# Patient Record
Sex: Female | Born: 1955 | Race: White | Hispanic: No | Marital: Married | Smoking: Former smoker
Health system: Southern US, Community
[De-identification: ages and names within clinical notes are randomized; demographics above are authoritative.]

## PROBLEM LIST (undated history)

## (undated) DIAGNOSIS — J302 Other seasonal allergic rhinitis: Secondary | ICD-10-CM

## (undated) DIAGNOSIS — I10 Essential (primary) hypertension: Secondary | ICD-10-CM

## (undated) HISTORY — PX: TUBAL LIGATION: SHX77

## (undated) HISTORY — PX: NOSE SURGERY: SHX723

## (undated) HISTORY — PX: KNEE ARTHROSCOPY: SUR90

## (undated) HISTORY — PX: ABDOMINAL HYSTERECTOMY: SHX81

---

## 1998-09-19 ENCOUNTER — Encounter: Payer: Self-pay | Admitting: Emergency Medicine

## 1998-09-19 ENCOUNTER — Emergency Department (HOSPITAL_COMMUNITY): Admission: EM | Admit: 1998-09-19 | Discharge: 1998-09-19 | Payer: Self-pay | Admitting: Emergency Medicine

## 2005-03-13 ENCOUNTER — Emergency Department (HOSPITAL_COMMUNITY): Admission: EM | Admit: 2005-03-13 | Discharge: 2005-03-13 | Payer: Self-pay | Admitting: Family Medicine

## 2005-03-24 ENCOUNTER — Encounter: Admission: RE | Admit: 2005-03-24 | Discharge: 2005-03-24 | Payer: Self-pay | Admitting: Family Medicine

## 2008-05-22 ENCOUNTER — Encounter: Admission: RE | Admit: 2008-05-22 | Discharge: 2008-05-22 | Payer: Self-pay | Admitting: Orthopedic Surgery

## 2011-04-23 ENCOUNTER — Other Ambulatory Visit: Payer: Self-pay | Admitting: Family Medicine

## 2011-04-23 MED ORDER — LISINOPRIL-HYDROCHLOROTHIAZIDE 20-25 MG PO TABS: 1.0000 | ORAL_TABLET | Freq: Every day | ORAL | Status: DC

## 2011-05-06 ENCOUNTER — Encounter: Payer: Self-pay | Admitting: Physician Assistant

## 2011-05-06 ENCOUNTER — Ambulatory Visit (INDEPENDENT_AMBULATORY_CARE_PROVIDER_SITE_OTHER): Payer: BC Managed Care – PPO | Admitting: Physician Assistant

## 2011-05-06 DIAGNOSIS — E119 Type 2 diabetes mellitus without complications: Secondary | ICD-10-CM

## 2011-05-06 DIAGNOSIS — R635 Abnormal weight gain: Secondary | ICD-10-CM

## 2011-05-06 DIAGNOSIS — E669 Obesity, unspecified: Secondary | ICD-10-CM

## 2011-05-06 DIAGNOSIS — I10 Essential (primary) hypertension: Secondary | ICD-10-CM

## 2011-05-06 LAB — LIPID PANEL
Cholesterol: 254 mg/dL — ABNORMAL HIGH (ref 0–200)
HDL: 45 mg/dL (ref >39)
LDL Cholesterol: 188 mg/dL — ABNORMAL HIGH (ref 0–99)
Total CHOL/HDL Ratio: 5.6 Ratio
Triglycerides: 106 mg/dL (ref <150)
VLDL: 21 mg/dL (ref 0–40)

## 2011-05-06 LAB — COMPREHENSIVE METABOLIC PANEL
ALT: 20 U/L (ref 0–35)
AST: 17 U/L (ref 0–37)
Albumin: 4.3 g/dL (ref 3.5–5.2)
Alkaline Phosphatase: 67 U/L (ref 39–117)
BUN: 21 mg/dL (ref 6–23)
CO2: 21 mEq/L (ref 19–32)
Calcium: 9.7 mg/dL (ref 8.4–10.5)
Chloride: 101 mEq/L (ref 96–112)
Creat: 0.78 mg/dL (ref 0.50–1.10)
Glucose, Bld: 122 mg/dL — ABNORMAL HIGH (ref 70–99)
Potassium: 4.3 mEq/L (ref 3.5–5.3)
Sodium: 138 mEq/L (ref 135–145)
Total Bilirubin: 0.4 mg/dL (ref 0.3–1.2)
Total Protein: 7.3 g/dL (ref 6.0–8.3)

## 2011-05-06 LAB — CBC WITH DIFFERENTIAL/PLATELET
Basophils Absolute: 0.1 10*3/uL (ref 0.0–0.1)
Basophils Relative: 1 % (ref 0–1)
Hemoglobin: 15.3 g/dL — ABNORMAL HIGH (ref 12.0–15.0)
MCHC: 33.3 g/dL (ref 30.0–36.0)
Monocytes Relative: 10 % (ref 3–12)
Neutro Abs: 3 10*3/uL (ref 1.7–7.7)
Neutrophils Relative %: 40 % — ABNORMAL LOW (ref 43–77)
Platelets: 269 10*3/uL (ref 150–400)

## 2011-05-06 LAB — TSH: TSH: 0.667 u[IU]/mL (ref 0.350–4.500)

## 2011-05-06 LAB — POCT GLYCOSYLATED HEMOGLOBIN (HGB A1C): Hemoglobin A1C: 6.2

## 2011-05-06 MED ORDER — LISINOPRIL-HYDROCHLOROTHIAZIDE 20-25 MG PO TABS: 1.0000 | ORAL_TABLET | Freq: Every day | ORAL | Status: DC

## 2011-05-06 MED ORDER — LIDOCAINE (ANORECTAL) 5 % EX GEL: 1.0000 "application " | Freq: Two times a day (BID) | CUTANEOUS | Status: DC

## 2011-05-06 MED ORDER — METFORMIN HCL 500 MG PO TABS: 500.0000 mg | ORAL_TABLET | Freq: Two times a day (BID) | ORAL | Status: DC

## 2011-05-06 NOTE — Patient Instructions (Signed)
Work on dietary changes and exercise for weight loss.

## 2011-05-06 NOTE — Progress Notes (Signed)
  Subjective:    Patient ID: Olivia Middleton, female    DOB: [birth year], 56 y.o.   MRN: [id number]  HPI Doing well, but has had a very stressful year and just found out she's losing her job in April.  She has accepted that and is now ready to start back with exercise and healthier diet. Has only been taking metformin once daily.  Has gained weight- last weight here was 276.  Review of Systems  All other systems reviewed and are negative.       Objective:   Physical Exam  Nursing note and vitals reviewed. Constitutional: She is oriented to person, place, and time. She appears well-developed and well-nourished.  Neck: Normal range of motion. Neck supple.  Cardiovascular: Normal rate, regular rhythm and normal heart sounds.   Pulmonary/Chest: Effort normal and breath sounds normal.  Neurological: She is alert and oriented to person, place, and time.  Skin: Skin is warm and dry.   Results for orders placed in visit on 05/06/11  POCT GLYCOSYLATED HEMOGLOBIN (HGB A1C)      Component Value Range   Hemoglobin A1C 6.2             Assessment & Plan:  htn-stable Diabetes-not controlled Weight gain

## 2011-05-08 LAB — VITAMIN D 1,25 DIHYDROXY
Vitamin D 1, 25 (OH)2 Total: 46 pg/mL (ref 18–72)
Vitamin D2 1, 25 (OH)2: <8 pg/mL
Vitamin D3 1, 25 (OH)2: 46 pg/mL

## 2011-07-01 ENCOUNTER — Emergency Department (HOSPITAL_COMMUNITY)
Admission: EM | Admit: 2011-07-01 | Discharge: 2011-07-01 | Disposition: A | Discharge status: Home Health | Payer: Self-pay | Source: Home / Self Care | Attending: Family Medicine | Admitting: Family Medicine

## 2011-07-01 ENCOUNTER — Encounter (HOSPITAL_COMMUNITY): Payer: Self-pay

## 2011-07-01 DIAGNOSIS — IMO0002 Reserved for concepts with insufficient information to code with codable children: Secondary | ICD-10-CM

## 2011-07-01 DIAGNOSIS — S39012A Strain of muscle, fascia and tendon of lower back, initial encounter: Secondary | ICD-10-CM

## 2011-07-01 HISTORY — DX: Essential (primary) hypertension: I10

## 2011-07-01 LAB — POCT URINALYSIS DIP (DEVICE)
Glucose, UA: NEGATIVE mg/dL
Leukocytes, UA: NEGATIVE
Nitrite: NEGATIVE
Urobilinogen, UA: 0.2 mg/dL (ref 0.0–1.0)

## 2011-07-01 MED ORDER — CYCLOBENZAPRINE HCL 5 MG PO TABS: 5.0000 mg | ORAL_TABLET | Freq: Three times a day (TID) | ORAL | Status: DC | PRN

## 2011-07-01 MED ORDER — KETOROLAC TROMETHAMINE 30 MG/ML IJ SOLN
INTRAMUSCULAR | Status: AC
  Filled 2011-07-01: qty 1

## 2011-07-01 MED ORDER — DICLOFENAC POTASSIUM 50 MG PO TABS: 50.0000 mg | ORAL_TABLET | Freq: Three times a day (TID) | ORAL | Status: DC

## 2011-07-01 MED ORDER — KETOROLAC TROMETHAMINE 30 MG/ML IJ SOLN
30.0000 mg | Freq: Once | INTRAMUSCULAR | Status: AC
  Administered 2011-07-01: 30 mg via INTRAMUSCULAR

## 2011-07-01 NOTE — ED Provider Notes (Signed)
History     CSN: [id number]  Arrival date & time 07/01/11  8657   First MD Initiated Contact with Patient 07/01/11 1953      Chief Complaint  Patient presents with  . Back Pain    (Consider location/radiation/quality/duration/timing/severity/associated sxs/prior treatment) Patient is a 56 y.o. female presenting with back pain. The history is provided by the patient and a parent.  Back Pain  This is a new problem. The current episode started yesterday. The problem occurs constantly. The problem has been gradually worsening. The pain is associated with no known injury (onset sl at funeral long standing yest, improved with advil , better until long drive to [facility] today, no gi or gu sx no radiation into legs, sore with movement.). The pain is present in the sacro-iliac joint.    Past Medical History  Diagnosis Date  . Hypertension   . Diabetes mellitus     Past Surgical History  Procedure Date  . Abdominal hysterectomy   . Knee arthroscopy     No family history on file.  History  Substance Use Topics  . Smoking status: Former Games developer  . Smokeless tobacco: Not on file  . Alcohol Use: Yes    OB History    Grav Para Term Preterm Abortions TAB SAB Ect Mult Living                  Review of Systems  Constitutional: Negative.   Gastrointestinal: Negative.   Genitourinary: Negative.   Musculoskeletal: Positive for back pain.  Neurological: Negative.     Allergies  Tetracyclines & related  Home Medications   Current Outpatient Rx  Name Route Sig Dispense Refill  . CYCLOBENZAPRINE HCL 5 MG PO TABS Oral Take 1 tablet (5 mg total) by mouth 3 (three) times daily as needed for muscle spasms. 30 tablet 0  . DICLOFENAC POTASSIUM 50 MG PO TABS Oral Take 1 tablet (50 mg total) by mouth 3 (three) times daily. 30 tablet 0  . LISINOPRIL-HYDROCHLOROTHIAZIDE 20-25 MG PO TABS Oral Take 1 tablet by mouth daily. 90 tablet 1    Needs office visit--2nd notice  . METFORMIN HCL 500 MG PO  TABS Oral Take 1 tablet (500 mg total) by mouth 2 (two) times daily with a meal. 180 tablet 2    BP 153/91  Pulse 95  Temp(Src) 98.2 F (36.8 C) (Oral)  Resp 23  SpO2 100%  Physical Exam  Nursing note and vitals reviewed. Constitutional: She is oriented to person, place, and time. She appears well-developed and well-nourished. She is cooperative.  Neck: Normal range of motion. Neck supple.  Abdominal: Soft. Bowel sounds are normal.  Musculoskeletal: She exhibits tenderness.       Arms: Lymphadenopathy:    She has no cervical adenopathy.  Neurological: She is alert and oriented to person, place, and time.  Skin: Skin is warm and dry.    ED Course  Procedures (including critical care time)  Labs Reviewed  POCT URINALYSIS DIP (DEVICE) - Abnormal; Notable for the following:    Bilirubin Urine SMALL (*)    Hgb urine dipstick SMALL (*)    Protein, ur 30 (*)    All other components within normal limits   No results found.   1. Back strain       MDM          Linna Hoff, MD 07/01/11 [phone]

## 2011-07-01 NOTE — ED Notes (Signed)
Pt c/o Lower back pain.  Pt states pain concentrated on L side.  Pt denies dysuria, hematuria.  States she has voided more frequently.  Pt took 600mg  Ibuprofen around 3pm with some relief.

## 2011-07-01 NOTE — ED Notes (Signed)
Bed:UCPR1<BR> Expected date:<BR> Expected time:<BR> Means of arrival:<BR> Comments:<BR>

## 2011-07-05 ENCOUNTER — Encounter (HOSPITAL_COMMUNITY): Payer: Self-pay | Admitting: *Deleted

## 2011-07-05 ENCOUNTER — Emergency Department (HOSPITAL_COMMUNITY)
Admission: EM | Admit: 2011-07-05 | Discharge: 2011-07-05 | Disposition: A | Discharge status: Home / Self Care | Payer: Self-pay | Source: Home / Self Care | Attending: Emergency Medicine | Admitting: Emergency Medicine

## 2011-07-05 DIAGNOSIS — S335XXA Sprain of ligaments of lumbar spine, initial encounter: Secondary | ICD-10-CM

## 2011-07-05 DIAGNOSIS — M461 Sacroiliitis, not elsewhere classified: Secondary | ICD-10-CM

## 2011-07-05 DIAGNOSIS — R05 Cough: Secondary | ICD-10-CM

## 2011-07-05 DIAGNOSIS — S39012A Strain of muscle, fascia and tendon of lower back, initial encounter: Secondary | ICD-10-CM

## 2011-07-05 HISTORY — DX: Other seasonal allergic rhinitis: J30.2

## 2011-07-05 MED ORDER — HYDROCODONE-ACETAMINOPHEN 5-325 MG PO TABS: 2.0000 | ORAL_TABLET | ORAL | Status: DC | PRN

## 2011-07-05 MED ORDER — PREDNISONE 20 MG PO TABS: ORAL_TABLET | ORAL | Status: AC

## 2011-07-05 MED ORDER — ALBUTEROL SULFATE HFA 108 (90 BASE) MCG/ACT IN AERS: 1.0000 | INHALATION_SPRAY | Freq: Four times a day (QID) | RESPIRATORY_TRACT | Status: DC | PRN

## 2011-07-05 MED ORDER — FLUTICASONE PROPIONATE 50 MCG/ACT NA SUSP: 2.0000 | Freq: Every day | NASAL | Status: DC

## 2011-07-05 NOTE — ED Provider Notes (Signed)
History     CSN: [id number]  Arrival date & time 07/05/11  0957   None     Chief Complaint  Patient presents with  . Back Pain    (Consider location/radiation/quality/duration/timing/severity/associated sxs/prior treatment) HPI Comments: Patient reports 6 days of achy, left sided, lower back/SI joint pain, worse with bending forward, lying down flat, sitting for prolonged periods of time, coughing. States it is worse during the day., Say is unable to find a comfortable position States it got worse after standing for a long period of time at a funeral, and a prolonged drive. States it is now wrapping around her hip. Denies any radiation down the back of her leg. No change in physical activity, recent or remote history of trauma to the back or hip. No fevers, nausea, vomiting, abdominal pain, urinary complaints, vaginal bleeding, saddle anesthesia, urinary/fecal incontinence, weakness in her legs, rash. She was evaluated for this 4 days ago the urgent care, thought to have low back strain, and sent home with Flexeril and diclofenac. States she is taking the Flexeril as directly, without much relief. She was taking the diclofenac as written, but states it made her dizzy, it wasn't helping, so she stopped it after a day. She has resumed taking 600 milligrams ibuprofen which helps somewhat. Patient has a history of chronic back pain, cancer, IVDU, prolonged steroid use, kidney stones.  Patient also reports having rhinorrhea, postnasal drip, nonproductive cough, sore throat which is worse at night for the past 2 weeks. Think that this may be from her allergies. Believes that her back pain is from all coughing that she is doing. No wheezing, chest pain, shortness of breath. No headaches, ear pain, sinus pain/pressure. Patient quit smoking 6 years ago. No history of asthma, emphysema, pneumonia.   ROS as noted in HPI. All other ROS negative.   Patient is a 56 y.o. female presenting with back pain and  cough. The history is provided by the patient.  Back Pain   Cough    Past Medical History  Diagnosis Date  . Hypertension   . Diabetes mellitus   . Seasonal allergies     Past Surgical History  Procedure Date  . Abdominal hysterectomy   . Knee arthroscopy     History reviewed. No pertinent family history.  History  Substance Use Topics  . Smoking status: Former Games developer  . Smokeless tobacco: Not on file  . Alcohol Use: Yes    OB History    Grav Para Term Preterm Abortions TAB SAB Ect Mult Living                  Review of Systems  Respiratory: Positive for cough.   Musculoskeletal: Positive for back pain.    Allergies  Tetracyclines & related  Home Medications   Current Outpatient Rx  Name Route Sig Dispense Refill  . CYCLOBENZAPRINE HCL 5 MG PO TABS Oral Take 1 tablet (5 mg total) by mouth 3 (three) times daily as needed for muscle spasms. 30 tablet 0  . IBUPROFEN 200 MG PO TABS Oral Take 600 mg by mouth every 6 (six) hours as needed.    Marland Kitchen LISINOPRIL-HYDROCHLOROTHIAZIDE 20-25 MG PO TABS Oral Take 1 tablet by mouth daily. 90 tablet 1    Needs office visit--2nd notice  . METFORMIN HCL 500 MG PO TABS Oral Take 1 tablet (500 mg total) by mouth 2 (two) times daily with a meal. 180 tablet 2  . ALBUTEROL SULFATE HFA 108 (90 BASE) MCG/ACT  IN AERS Inhalation Inhale 1-2 puffs into the lungs every 6 (six) hours as needed for wheezing. 1 Inhaler 0  . FLUTICASONE PROPIONATE 50 MCG/ACT NA SUSP Nasal Place 2 sprays into the nose daily. 16 g 2  . HYDROCODONE-ACETAMINOPHEN 5-325 MG PO TABS Oral Take 2 tablets by mouth every 4 (four) hours as needed for pain. 20 tablet 0  . PREDNISONE 20 MG PO TABS  Take 3 tabs po on first day, 2 tabs second day, 2 tabs third day, 1 tab fourth day, 1 tab 5th day. Take with food. 9 tablet 0    BP 149/93  Pulse 96  Temp(Src) 97.6 F (36.4 C) (Oral)  Resp 20  SpO2 96%  Physical Exam  Nursing note and vitals reviewed. Constitutional: She  is oriented to person, place, and time. She appears well-developed and well-nourished. No distress.  HENT:  Head: Normocephalic and atraumatic.  Right Ear: Tympanic membrane normal.  Left Ear: Tympanic membrane normal.  Nose: Mucosal edema and rhinorrhea present. Right sinus exhibits no maxillary sinus tenderness and no frontal sinus tenderness. Left sinus exhibits no maxillary sinus tenderness and no frontal sinus tenderness.       Irritated, cobblestoned oropharynx.  Eyes: Conjunctivae and EOM are normal.  Neck: Normal range of motion.  Cardiovascular: Normal rate, regular rhythm and normal heart sounds.   Pulmonary/Chest: Effort normal and breath sounds normal. She exhibits tenderness.       Diffuse chest wall tenderness  Abdominal: Soft. Bowel sounds are normal. She exhibits no distension and no mass. There is no rebound, no guarding and no CVA tenderness.       Diffuse tenderness over rectus abdominis  Musculoskeletal: Normal range of motion.       Back:       Left SI joint tenderness. Bilateral lower extremities nontender, baseline ROM with intact  PT pulses. No pain with PROM hips bilaterally. SLR neg bilaterally. Sensation baseline light touch bilaterally for Pt, DTR's symmetric and intact bilaterally KJ. Motor 4/5 hip flexion left side secondary to pain, but 5/5, quadriceps, hamstrings, EHL, foot dorsiflexion, foot plantarflexion bilaterally, gait somewhat antalgic but without apparent new ataxia.   Neurological: She is alert and oriented to person, place, and time.  Skin: Skin is warm and dry.  Psychiatric: She has a normal mood and affect. Her behavior is normal. Judgment and thought content normal.    ED Course  Procedures (including critical care time)  Labs Reviewed - No data to display No results found.   1. Allergic cough   2. Sacroiliac inflammation   3. Lumbar strain     MDM  Previous records and labs reviewed. Patient was seen here on 5/14 back pain located in  the SI joint. Thought to have back strain. Sent home with Flexeril diclofenac.   [place] narcotic database reviewed. Pt with no narcotic rx over the past year.   H&P most consistent with cough from allergies. Starting her on Flonase, saline nasal irrigation. May also have a component of reactive airways with this. Sending home with albuterol inhaler. Will send home with Norco to help with her cough, but this will also help with her lower back pain. Will have her continue the Flexeril, ibuprofen. Sending home with steroids, which will help with reactive airways, also with SI joint inflammation. Discussed with her that this will aid her sugars. She'll follow with her primary care physician as needed. Patient agrees with plan.     Luiz Blare, MD 07/05/11 1056

## 2011-07-05 NOTE — Discharge Instructions (Signed)
Take the medication as written. Take 1 gram of tylenol with the motrin up to 4 times a day as needed for pain and fever. This is an effective combination for pain. Take the hydrocodone/norco only for severe pain. Do not take the tylenol and hydrocodone/norcot as they both have tylenol in them and too much can hurt your liver. Return if you get worse, have a  fever >100.4, or for any concerns.   2 puffs from your albuterol every 4-6 hrs. Decrease the frequency as you feel better. it can make your heart beat fast, so decrease the use if it is making you feel too jittery or giving you palpitations. Start using a neilmed sinus rinse, neti pot, or saline nasal irrigation to help with your nasal congestion.   Go to www.goodrx.com to look up your medications. This will give you a list of where you can find your prescriptions at the most affordable prices.

## 2011-07-05 NOTE — ED Notes (Addendum)
Pt with c/o left lower back pain now radiating down left thigh - pain worse with coughing - pain at rest and with movement - pt with c/o allergies with cough x 2 weeks  - pt seen and treated Wednesday low back pain flexeril and diclofenac given per pt has not been taking diclofenac made her feel dizzy

## 2011-07-11 ENCOUNTER — Ambulatory Visit: Payer: Self-pay

## 2011-07-11 ENCOUNTER — Ambulatory Visit: Payer: Self-pay | Admitting: Family Medicine

## 2011-07-11 VITALS — BP 130/80 | HR 82 | Temp 98.0°F | Resp 16 | Ht 66.0 in | Wt 293.0 lb

## 2011-07-11 DIAGNOSIS — M549 Dorsalgia, unspecified: Secondary | ICD-10-CM

## 2011-07-11 MED ORDER — HYDROCODONE-ACETAMINOPHEN 5-325 MG PO TABS: 1.0000 | ORAL_TABLET | ORAL | Status: AC | PRN

## 2011-07-11 MED ORDER — PREDNISONE 20 MG PO TABS: ORAL_TABLET | ORAL | Status: AC

## 2011-07-11 MED ORDER — CYCLOBENZAPRINE HCL 10 MG PO TABS: 10.0000 mg | ORAL_TABLET | Freq: Three times a day (TID) | ORAL | Status: AC | PRN

## 2011-07-11 NOTE — Progress Notes (Signed)
Patient Name: Olivia Middleton Date of Birth: [birth year] Medical Record Number: [id number] Gender: female Date of Encounter: 07/11/2011  History of Present Illness:  Olivia Middleton is a 56 y.o. very pleasant female patient who presents with the following:  Here to evaluate back pain.  On 07/01/11 she drove to [place] and back.  She noted back pain during the drive.  Then suddenly she had worsening pain after she got out of the car.  She has been to [facility] twice and been treated with muscle relaxers, pain medication and steriods. She also notes numbness in his left anterior thigh. She also notes stress incontinence with cough or sneeze but this is not new.  However, she has also had a cough recently so this is exacerbating the problem.  She was started on prednisone and hydrocodone when she visited the UC the second time on 07/05/11- this did sem to help but after her 5 days of prednisone were finished she felt worse again.    Olivia Middleton had excellent A1c in March, but she does not usually check her glucose.  She is without insurance right now so extensive evaluation (MRI) is very difficult for her  Patient Active Problem List  Diagnoses  . Essential hypertension, benign  . Type II or unspecified type diabetes mellitus without mention of complication, not stated as uncontrolled   Past Medical History  Diagnosis Date  . Hypertension   . Diabetes mellitus   . Seasonal allergies    Past Surgical History  Procedure Date  . Abdominal hysterectomy   . Knee arthroscopy    History  Substance Use Topics  . Smoking status: Former Games developer  . Smokeless tobacco: Not on file  . Alcohol Use: Yes   No family history on file. Allergies  Allergen Reactions  . Tetracyclines & Related     Medication list has been reviewed and updated.  Review of Systems: As per HPI- otherwise negative.  Physical Examination: Filed Vitals:   07/11/11 1257  BP: 130/80  Pulse: 82  Temp: 98 F (36.7 C)  TempSrc:  Oral  Resp: 16  Height: 5\' 6"  (1.676 m)  Weight: 293 lb (132.904 kg)    Body mass index is 47.29 kg/(m^2).  GEN: WDWN, NAD, Non-toxic, A & O x 3, obese HEENT: Atraumatic, Normocephalic. Neck supple. No masses, No LAD.  TM, oropharynx wnl Ears and Nose: No external deformity. CV: RRR, No M/G/R. No JVD. No thrill. No extra heart sounds. PULM: CTA B, no wheezes, crackles, rhonchi. No retractions. No resp. distress. No accessory muscle use. EXTR: No c/c/e NEURO Normal gait.  PSYCH: Normally interactive. Conversant. Not depressed or anxious appearing.  Calm demeanor.  She has tenderness around her left SI joint and into her left buttock.  Spine flexion and extension is preserved.  Excellent strength of both legs, no foot drop, no gait change.  She has decreased sensation in the left anterior thigh and into her left hip flexor area, but does not have genital/ anal/ inner thigh numbness. Normal rectal tone.  Normal patellar DTR.  Negative straight leg raise  [facility] reading (PRIMARY) by  Dr. Patsy Lager.  Negative lumbar spine  LUMBAR SPINE - COMPLETE 4+ VIEW  Comparison: Preliminary reading of Dr. Patsy Lager  Findings: Five views of the lumbar spine submitted. No acute fracture or subluxation. Study is limited by patient's large body habitus. Atherosclerotic calcifications of the abdominal aorta and the iliac arteries are noted. Alignment and vertebral height are preserved.  IMPRESSION: No acute  fracture or subluxation.   Assessment and Plan 1. Back pain  DG Lumbar Spine Complete, HYDROcodone-acetaminophen (NORCO) 5-325 MG per tablet, cyclobenzaprine (FLEXERIL) 10 MG tablet, predniSONE (DELTASONE) 20 MG tablet   Suspect that Diany likely has a bulging disc and nerve impingement vs sciatica.  Had a long conversation with her and her husband about CES- however she does not meet these criteria at this time.  Went over the symptoms (genital numbness, incontinence not just due to cough/ sneeze,  difficulty walking or leg weakness) which should prompt an urgent trip to the ED.  Raeonna does have DM II, but had an excellent A1c  2 months ago.  Should be able to tolerate prednisone but urged her to drink plenty of water and watch her diet.  If she is not feeling better with the prednisone, or if her symptoms worsen or change she will call or RTC.  Next step is likely an MRI.

## 2011-11-11 ENCOUNTER — Ambulatory Visit: Payer: BC Managed Care – PPO | Admitting: Physician Assistant

## 2013-03-04 IMAGING — CR DG LUMBAR SPINE COMPLETE 4+V
5 series · 5 of 5 positions shown · non-contrast
Comparison: Preliminary reading of Dr. Habtom

CLINICAL DATA: Lower back pain

LUMBAR SPINE - COMPLETE 4+ VIEW

[AP]
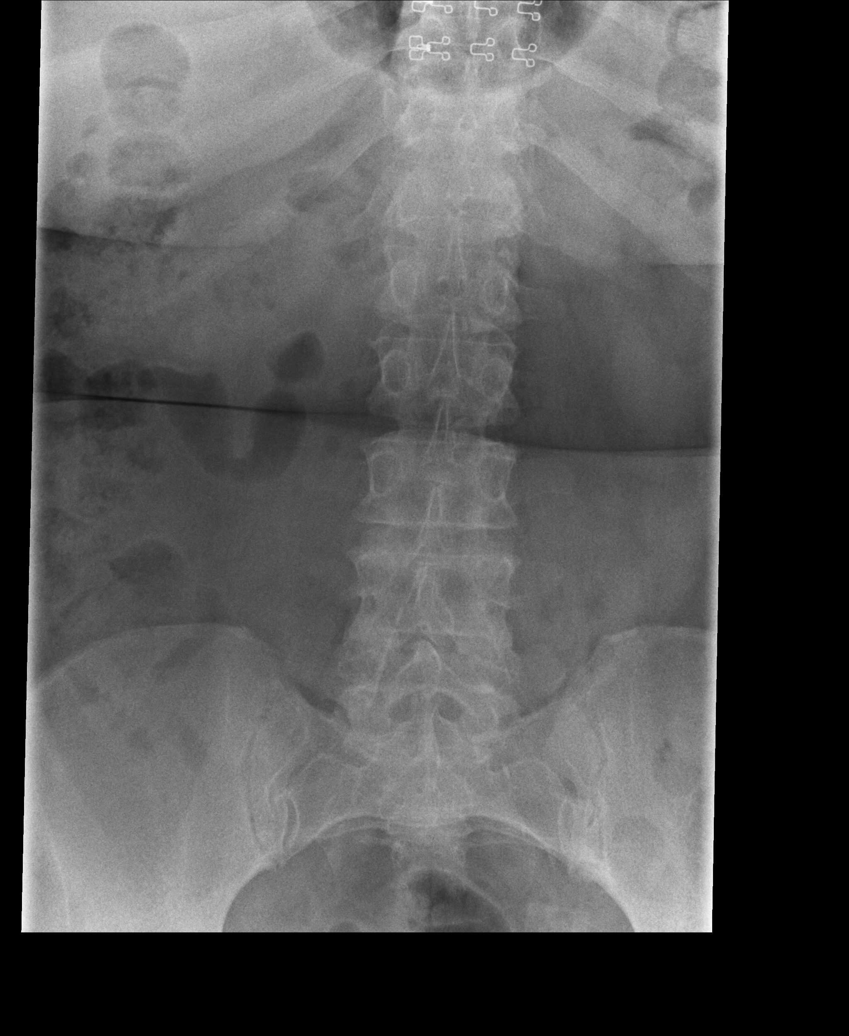

[rpo]
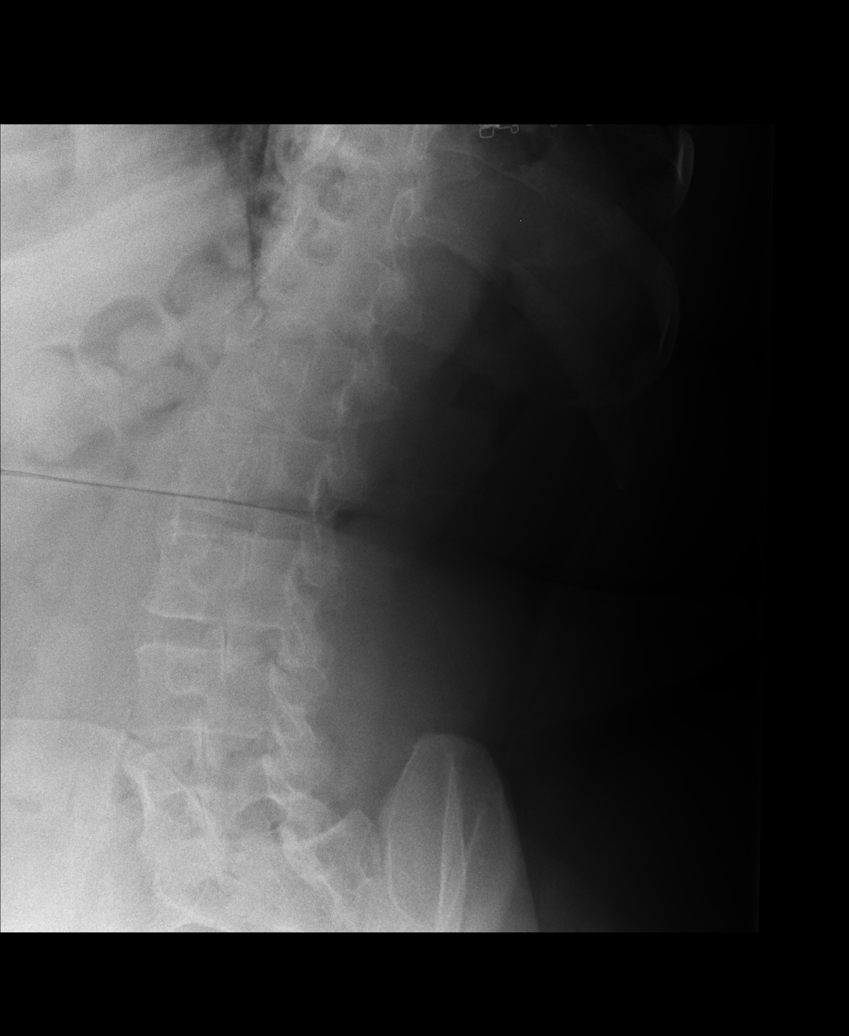

[lpo]
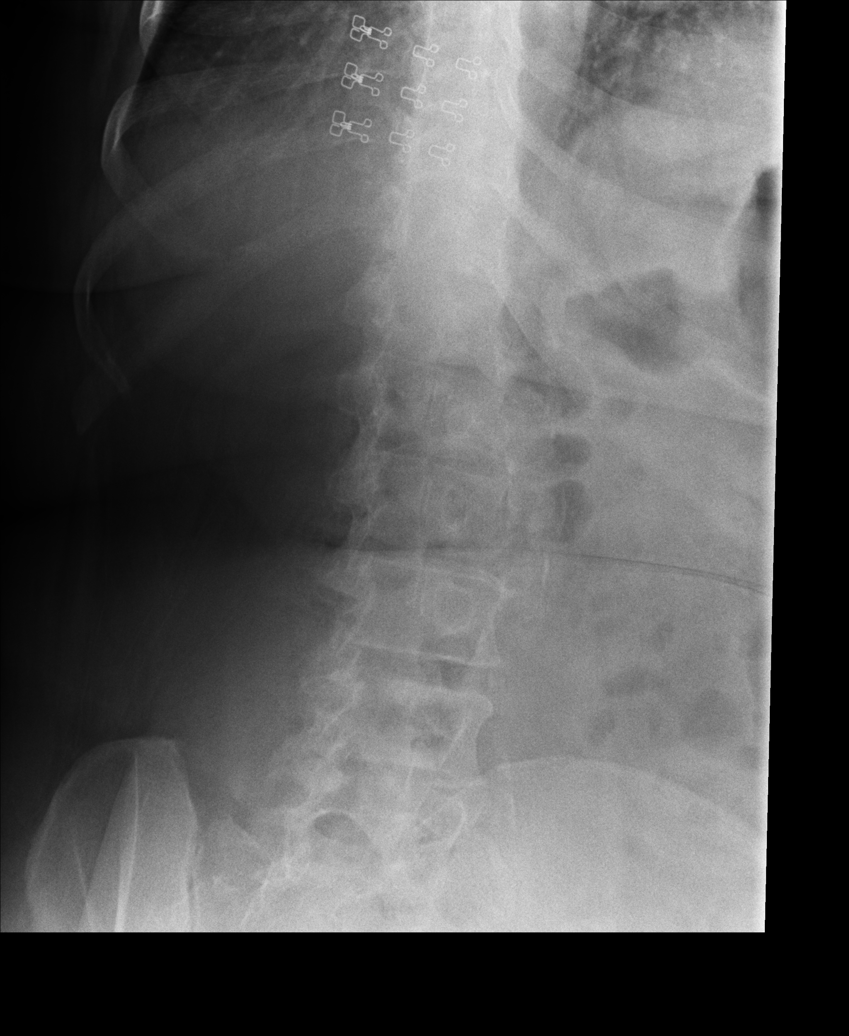

[lateral]
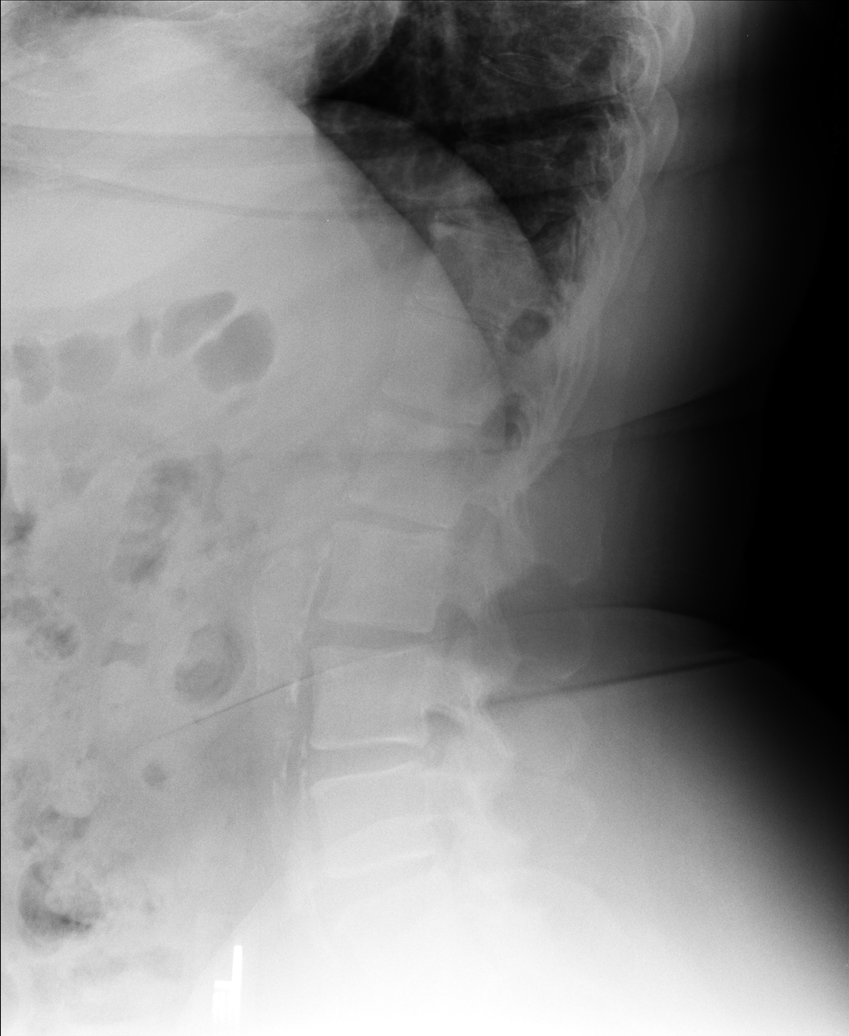

[l5 s1]
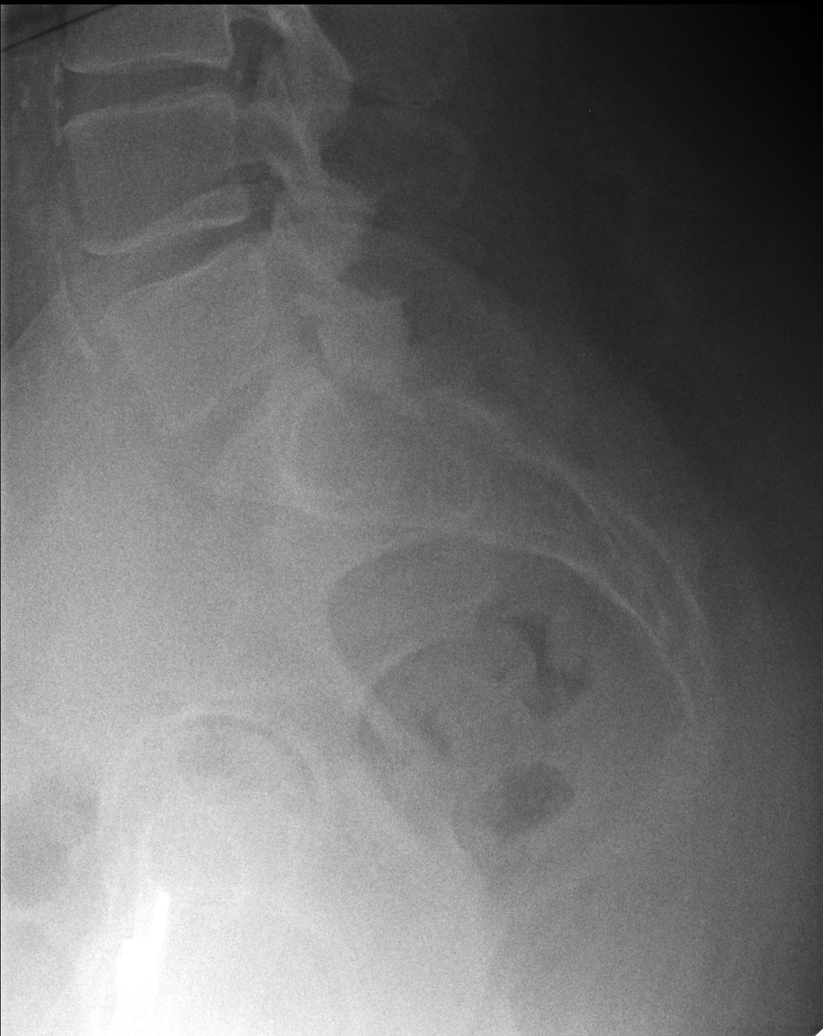

[5 of 5 positions shown; findings below may reference images not displayed]

FINDINGS: Five views of the lumbar spine submitted.  No acute
fracture or subluxation.  Study is limited by patient's large body
habitus.  Atherosclerotic calcifications of the abdominal aorta and
the iliac arteries are noted.  Alignment and vertebral height are
preserved.
IMPRESSION: No acute fracture or subluxation.

Clinically significant discrepancy from primary report, if
provided: None

## 2014-02-14 ENCOUNTER — Emergency Department (HOSPITAL_COMMUNITY)
Admission: EM | Admit: 2014-02-14 | Discharge: 2014-02-14 | Disposition: A | Discharge status: Home / Self Care | Payer: BC Managed Care – PPO | Source: Home / Self Care | Attending: Emergency Medicine | Admitting: Emergency Medicine

## 2014-02-14 ENCOUNTER — Encounter (HOSPITAL_COMMUNITY): Payer: Self-pay | Admitting: *Deleted

## 2014-02-14 DIAGNOSIS — N390 Urinary tract infection, site not specified: Secondary | ICD-10-CM

## 2014-02-14 LAB — POCT URINALYSIS DIP (DEVICE)
BILIRUBIN URINE: NEGATIVE
GLUCOSE, UA: NEGATIVE mg/dL
Ketones, ur: NEGATIVE mg/dL
NITRITE: NEGATIVE
Protein, ur: 30 mg/dL — AB
SPECIFIC GRAVITY, URINE: 1.025 (ref 1.005–1.030)
UROBILINOGEN UA: 1 mg/dL (ref 0.0–1.0)
pH: 6.5 (ref 5.0–8.0)

## 2014-02-14 MED ORDER — CIPROFLOXACIN HCL 500 MG PO TABS: 500.0000 mg | ORAL_TABLET | Freq: Two times a day (BID) | ORAL | Status: DC

## 2014-02-14 NOTE — ED Provider Notes (Signed)
CSN: [id number]     Arrival date & time 02/14/14  1113 History   None    Chief Complaint  Patient presents with  . Urinary Tract Infection   (Consider location/radiation/quality/duration/timing/severity/associated sxs/prior Treatment) HPI Olivia Middleton is a 58 y.o. female who [facility]. She states that about 4 days ago she had urgency with mild dysuria at the end of the stream. She took OTC AZO and got relief. The past 2 days the symptoms returned and are worse. She has had a few UTI's over the last several years and this feels the same. She denies any other problems.   Past Medical History  Diagnosis Date  . Hypertension   . Diabetes mellitus   . Seasonal allergies    Past Surgical History  Procedure Laterality Date  . Abdominal hysterectomy    . Knee arthroscopy     History reviewed. No pertinent family history. History  Substance Use Topics  . Smoking status: Former Games developermoker  . Smokeless tobacco: Not on file  . Alcohol Use: Yes   OB History    No data available     Review of Systems Negative except as stated in HPI  Allergies  Tetracyclines & related  Home Medications   Prior to Admission medications   Medication Sig Start Date End Date Taking? Authorizing Provider  albuterol (PROVENTIL HFA;VENTOLIN HFA) 108 (90 BASE) MCG/ACT inhaler Inhale 1-2 puffs into the lungs every 6 (six) hours as needed for wheezing. 07/05/11 07/04/12  Domenick GongAshley Mortenson, MD  ciprofloxacin (CIPRO) 500 MG tablet Take 1 tablet (500 mg total) by mouth 2 (two) times daily. 02/14/14   Philip Kotlyar Orlene OchM Ephram Kornegay, NP  fluticasone (FLONASE) 50 MCG/ACT nasal spray Place 2 sprays into the nose daily. 07/05/11 07/04/12  Domenick GongAshley Mortenson, MD  ibuprofen (ADVIL,MOTRIN) 200 MG tablet Take 600 mg by mouth every 6 (six) hours as needed.    Historical Provider, MD  lisinopril-hydrochlorothiazide (PRINZIDE,ZESTORETIC) 20-25 MG per tablet Take 1 tablet by mouth daily. 05/06/11 05/05/12  Anders SimmondsAngela M McClung, PA-C   metFORMIN (GLUCOPHAGE) 500 MG tablet Take 1 tablet (500 mg total) by mouth 2 (two) times daily with a meal. 05/06/11   Marzella SchleinAngela M McClung, PA-C   BP 178/86 mmHg  Pulse 82  Temp(Src) 98.2 F (36.8 C) (Oral)  Resp 16  SpO2 95% Physical Exam  Constitutional: She is oriented to person, place, and time. She appears well-developed and well-nourished.  HENT:  Head: Normocephalic and atraumatic.  Eyes: EOM are normal.  Neck: Neck supple.  Cardiovascular: Normal rate and regular rhythm.   Pulmonary/Chest: Effort normal and breath sounds normal.  Abdominal: Soft. Bowel sounds are normal. There is no tenderness. There is no CVA tenderness.  Musculoskeletal: Normal range of motion.  Neurological: She is alert and oriented to person, place, and time. No cranial nerve deficit.  Skin: Skin is warm and dry.  Psychiatric: She has a normal mood and affect. Her behavior is normal.  Nursing note and vitals reviewed.   ED Course  Procedures (including critical care time) Results for orders placed or performed during the hospital encounter of 02/14/14 (from the past 24 hour(s))  POCT urinalysis dip (device)     Status: Abnormal   Collection Time: 02/14/14 12:10 PM  Result Value Ref Range   Glucose, UA NEGATIVE NEGATIVE mg/dL   Bilirubin Urine NEGATIVE NEGATIVE   Ketones, ur NEGATIVE NEGATIVE mg/dL   Specific Gravity, Urine 1.025 1.005 - 1.030   Hgb urine dipstick MODERATE (A)  NEGATIVE   pH 6.5 5.0 - 8.0   Protein, ur 30 (A) NEGATIVE mg/dL   Urobilinogen, UA 1.0 0.0 - 1.0 mg/dL   Nitrite NEGATIVE NEGATIVE   Leukocytes, UA LARGE (A) NEGATIVE    Imaging Review No results found. Urine sent for culture  MDM  58 y.o. female with dysuria. Stable for d/c without fever, chills or signs of pyelo. Discussed with the patient and all questioned fully answered. She will return if any problems arise.    Medication List    TAKE these medications        ciprofloxacin 500 MG tablet  Commonly known as:   CIPRO  Take 1 tablet (500 mg total) by mouth 2 (two) times daily.      ASK your doctor about these medications        albuterol 108 (90 BASE) MCG/ACT inhaler  Commonly known as:  PROVENTIL HFA;VENTOLIN HFA  Inhale 1-2 puffs into the lungs every 6 (six) hours as needed for wheezing.     fluticasone 50 MCG/ACT nasal spray  Commonly known as:  FLONASE  Place 2 sprays into the nose daily.     ibuprofen 200 MG tablet  Commonly known as:  ADVIL,MOTRIN  Take 600 mg by mouth every 6 (six) hours as needed.     lisinopril-hydrochlorothiazide 20-25 MG per tablet  Commonly known as:  PRINZIDE,ZESTORETIC  Take 1 tablet by mouth daily.     metFORMIN 500 MG tablet  Commonly known as:  GLUCOPHAGE  Take 1 tablet (500 mg total) by mouth 2 (two) times daily with a meal.        1. UTI (lower urinary tract infection)     Janne NapoleonHope M Clarabell Matsuoka, NP 02/14/14 1227

## 2014-02-14 NOTE — ED Notes (Signed)
Pt    Reports   Symptoms  Of  Urinary  Frequency    With    Burning  On  Urination       With  Symptoms    X   5  Days          Pt  states  Has  Been taking  Azo   With   Only  Partial  releif

## 2014-02-21 ENCOUNTER — Telehealth (HOSPITAL_COMMUNITY): Payer: Self-pay | Admitting: *Deleted

## 2014-02-21 NOTE — ED Notes (Addendum)
Urine culture cancelled.  Dr. Lorenz CoasterKeller asked me to call pt. for clinical improvement.  She is on Cipro.  I called pt. and left a message to call.  Call 1. Olivia Middleton, Olivia Middleton 02/21/2014 Pt. called back and said she has finished her medicine today and has no symptoms.  She is better.  Dr. Lorenz CoasterKeller notified. Olivia Middleton, Olivia Middleton 02/21/2014

## 2014-04-13 NOTE — ED Notes (Signed)
Urine culture cancelled.  Dr. Lorenz CoasterKeller asked me to call pt. for clinical improvement.  She is on Cipro.  I called pt. and left a message to call.  Call 1. Olivia Middleton, Ashaki Frosch M 04/13/2014 Pt. called back and said she has finished her medicine today and has no symptoms.  She is better.  Dr. Lorenz CoasterKeller notified. Cherly AndersonYork, Manette Doto M

## 2014-04-16 ENCOUNTER — Encounter: Payer: Self-pay | Admitting: Family Medicine

## 2014-04-16 ENCOUNTER — Ambulatory Visit (INDEPENDENT_AMBULATORY_CARE_PROVIDER_SITE_OTHER): Payer: BLUE CROSS/BLUE SHIELD | Admitting: Family Medicine

## 2014-04-16 VITALS — BP 180/102 | HR 88 | Temp 98.1°F | Resp 16 | Ht 66.5 in | Wt 325.0 lb

## 2014-04-16 DIAGNOSIS — Z6841 Body Mass Index (BMI) 40.0 and over, adult: Secondary | ICD-10-CM

## 2014-04-16 DIAGNOSIS — Z8639 Personal history of other endocrine, nutritional and metabolic disease: Secondary | ICD-10-CM

## 2014-04-16 DIAGNOSIS — I10 Essential (primary) hypertension: Secondary | ICD-10-CM

## 2014-04-16 LAB — COMPREHENSIVE METABOLIC PANEL
ALBUMIN: 3.7 g/dL (ref 3.5–5.2)
ALK PHOS: 77 U/L (ref 39–117)
ALT: 28 U/L (ref 0–35)
AST: 23 U/L (ref 0–37)
BUN: 12 mg/dL (ref 6–23)
CO2: 25 mEq/L (ref 19–32)
CREATININE: 0.77 mg/dL (ref 0.50–1.10)
Calcium: 9 mg/dL (ref 8.4–10.5)
Chloride: 103 mEq/L (ref 96–112)
GLUCOSE: 200 mg/dL — AB (ref 70–99)
POTASSIUM: 3.8 meq/L (ref 3.5–5.3)
Sodium: 136 mEq/L (ref 135–145)
Total Bilirubin: 0.4 mg/dL (ref 0.2–1.2)
Total Protein: 6.7 g/dL (ref 6.0–8.3)

## 2014-04-16 LAB — CBC
HCT: 43.9 % (ref 36.0–46.0)
Hemoglobin: 15.1 g/dL — ABNORMAL HIGH (ref 12.0–15.0)
MCH: 30 pg (ref 26.0–34.0)
MCHC: 34.4 g/dL (ref 30.0–36.0)
MCV: 87.1 fL (ref 78.0–100.0)
MPV: 10.2 fL (ref 8.6–12.4)
Platelets: 269 K/uL (ref 150–400)
RBC: 5.04 MIL/uL (ref 3.87–5.11)
RDW: 13.6 % (ref 11.5–15.5)
WBC: 8.4 K/uL (ref 4.0–10.5)

## 2014-04-16 LAB — TSH: TSH: 0.359 u[IU]/mL (ref 0.350–4.500)

## 2014-04-16 MED ORDER — AMLODIPINE BESYLATE 5 MG PO TABS: 5.0000 mg | ORAL_TABLET | Freq: Every day | ORAL | Status: DC

## 2014-04-16 MED ORDER — BLOOD PRESSURE MONITORING DEVI: 1.0000 [IU] | Freq: Once | Status: DC

## 2014-04-16 NOTE — Patient Instructions (Addendum)
Please start your medication as soon as possible Take your blood pressure reading once a day and keep a log Come in Wed., 2/24 for a blood pressure check Aim for 1/2- 1 pound weight loss per week- increase protein (meats, eggs, nuts, beans,cheese), vegetables and fruits Avoid breads, white rice, white potatoes, pasta, sweets, soda Eat every 3-4 hours while awake  Hypertension Hypertension, commonly called high blood pressure, is when the force of blood pumping through your arteries is too strong. Your arteries are the blood vessels that carry blood from your heart throughout your body. A blood pressure reading consists of a higher number over a lower number, such as 110/72. The higher number (systolic) is the pressure inside your arteries when your heart pumps. The lower number (diastolic) is the pressure inside your arteries when your heart relaxes. Ideally you want your blood pressure below 120/80. Hypertension forces your heart to work harder to pump blood. Your arteries may become narrow or stiff. Having hypertension puts you at risk for heart disease, stroke, and other problems.  RISK FACTORS Some risk factors for high blood pressure are controllable. Others are not.  Risk factors you cannot control include:   Race. You may be at higher risk if you are African American.  Age. Risk increases with age.  Gender. Men are at higher risk than women before age 59 years. After age 59, women are at higher risk than men. Risk factors you can control include:  Not getting enough exercise or physical activity.  Being overweight.  Getting too much fat, sugar, calories, or salt in your diet.  Drinking too much alcohol. SIGNS AND SYMPTOMS Hypertension does not usually cause signs or symptoms. Extremely high blood pressure (hypertensive crisis) may cause headache, anxiety, shortness of breath, and nosebleed. DIAGNOSIS  To check if you have hypertension, your health care provider will measure your  blood pressure while you are seated, with your arm held at the level of your heart. It should be measured at least twice using the same arm. Certain conditions can cause a difference in blood pressure between your right and left arms. A blood pressure reading that is higher than normal on one occasion does not mean that you need treatment. If one blood pressure reading is high, ask your health care provider about having it checked again. TREATMENT  Treating high blood pressure includes making lifestyle changes and possibly taking medicine. Living a healthy lifestyle can help lower high blood pressure. You may need to change some of your habits. Lifestyle changes may include:  Following the DASH diet. This diet is high in fruits, vegetables, and whole grains. It is low in salt, red meat, and added sugars.  Getting at least 2 hours of brisk physical activity every week.  Losing weight if necessary.  Not smoking.  Limiting alcoholic beverages.  Learning ways to reduce stress. If lifestyle changes are not enough to get your blood pressure under control, your health care provider may prescribe medicine. You may need to take more than one. Work closely with your health care provider to understand the risks and benefits. HOME CARE INSTRUCTIONS  Have your blood pressure rechecked as directed by your health care provider.   Take medicines only as directed by your health care provider. Follow the directions carefully. Blood pressure medicines must be taken as prescribed. The medicine does not work as well when you skip doses. Skipping doses also puts you at risk for problems.   Do not smoke.   Monitor  your blood pressure at home as directed by your health care provider. SEEK MEDICAL CARE IF:   You think you are having a reaction to medicines taken.  You have recurrent headaches or feel dizzy.  You have swelling in your ankles.  You have trouble with your vision. SEEK IMMEDIATE MEDICAL  CARE IF:  You develop a severe headache or confusion.  You have unusual weakness, numbness, or feel faint.  You have severe chest or abdominal pain.  You vomit repeatedly.  You have trouble breathing. MAKE SURE YOU:   Understand these instructions.  Will watch your condition.  Will get help right away if you are not doing well or get worse. Document Released: 02/16/2005 Document Revised: 07/03/2013 Document Reviewed: 12/09/2012 [facility] Patient Information 2015 [place], [place]. This information is not intended to replace advice given to you by your health care provider. Make sure you discuss any questions you have with your health care provider.

## 2014-04-16 NOTE — Progress Notes (Signed)
Subjective:    Patient ID: Olivia MccoyDebra Middleton, female    DOB: 1955/10/04, 59 y.o.   MRN: [id number]  HPI This is a very pleasant 59 yo female who is accompanied by her husband. The patient presents today for follow up HTN. Had physical for [employer] and bp was elevated. She is required to have 3 consecutive normal blood pressures documented for her employer. She has a desk job with the school sytem.   Has not had regular care in at least 3 years. She was diagnosed with elevated blood sugar and HTN in the past and was on metformin and lisinopril-HCTZ. She was feeling poorly in general and stopped her medicine because she thought her symptoms were possibly related to her meds. She reports that she feels ok now, just needs to increase her activity and loose some weight.   Has had many life stressors over last couple of years. She has gained 35 pounds in the last 3 years. Her company relocated and she has been trying to find work with health insurance for several years. Has recently started her job with [employer]. Finds it tiring. She does not eat very much. Has Cheerios for breakfast, a sandwich and chips for lunch, 1 small Coke daily, husband cooks for dinner.   Review of Systems No chest pain, SOB with stairs and exertion, none with rest, occasional swelling in feet/ankles    Objective:   Physical Exam  Constitutional: She is oriented to person, place, and time. She appears well-developed and well-nourished.  HENT:  Head: Normocephalic and atraumatic.  Right Ear: Tympanic membrane, external ear and ear canal normal.  Left Ear: Tympanic membrane, external ear and ear canal normal.  Nose: Nose normal.  Mouth/Throat: Oropharynx is clear and moist.  Eyes: Conjunctivae are normal. Pupils are equal, round, and reactive to light.  Neck: Normal range of motion. Neck supple.  Cardiovascular: Normal rate, regular rhythm and normal heart sounds.   Pulmonary/Chest: Effort normal and breath sounds normal.    Musculoskeletal: Normal range of motion.  Neurological: She is alert and oriented to person, place, and time.  Skin: Skin is warm and dry.  Psychiatric: She has a normal mood and affect. Her behavior is normal. Judgment and thought content normal.  Vitals reviewed.  BP 180/102 mmHg  Pulse 88  Temp(Src) 98.1 F (36.7 C) (Oral)  Resp 16  Ht 5' 6.5" (1.689 m)  Wt 325 lb (147.419 kg)  BMI 51.68 kg/m2  SpO2 96%    Assessment & Plan:  1. Essential hypertension - amLODipine (NORVASC) 5 MG tablet; Take 1 tablet (5 mg total) by mouth daily.  Dispense: 90 tablet; Refill: 1 - CBC - Comprehensive metabolic panel - Blood Pressure Monitoring DEVI; 1 Units by Does not apply route once.  Dispense: 1 Device; Refill: 0 - She will check her BP daily for the next 9 days and bring in to office when she presents for a BP check  2. Adult BMI 50.0-59.9 kg/sq m  3. Morbid obesity - Comprehensive metabolic panel - TSH - Hemoglobin A1c - Encouraged 1/2-1 pound weight loss per week, instructed to avoid starches (bread, rice, white potatoes, pasta), sweets, soda - Encouraged her to eat every 3-4 hours and have protein and fruit/veggie at every snack and meal.   4. History of elevated glucose - Comprehensive metabolic panel - Hemoglobin A1c  -Follow up with CPE 4-6 weeks Emi Belfasteborah B. Gessner, FNP-BC  [facility], [facility]  04/16/2014 3:49 PM

## 2014-04-17 LAB — HEMOGLOBIN A1C
HEMOGLOBIN A1C: 7.5 % — AB (ref <5.7)
MEAN PLASMA GLUCOSE: 169 mg/dL — AB (ref <117)

## 2014-04-18 ENCOUNTER — Telehealth: Payer: Self-pay | Admitting: Family Medicine

## 2014-04-18 ENCOUNTER — Other Ambulatory Visit: Payer: Self-pay | Admitting: Family Medicine

## 2014-04-18 DIAGNOSIS — E669 Obesity, unspecified: Principal | ICD-10-CM

## 2014-04-18 DIAGNOSIS — E1169 Type 2 diabetes mellitus with other specified complication: Secondary | ICD-10-CM

## 2014-04-18 MED ORDER — METFORMIN HCL 1000 MG PO TABS: 500.0000 mg | ORAL_TABLET | Freq: Two times a day (BID) | ORAL | Status: DC

## 2014-04-18 MED ORDER — BLOOD GLUCOSE MONITOR KIT: PACK | Status: DC

## 2014-04-18 NOTE — Telephone Encounter (Signed)
Attempted to call patient regarding lab results. No answer. Will try later today.

## 2014-04-25 ENCOUNTER — Ambulatory Visit (INDEPENDENT_AMBULATORY_CARE_PROVIDER_SITE_OTHER): Payer: BLUE CROSS/BLUE SHIELD | Admitting: Family Medicine

## 2014-04-25 DIAGNOSIS — I10 Essential (primary) hypertension: Secondary | ICD-10-CM

## 2014-04-25 DIAGNOSIS — E1165 Type 2 diabetes mellitus with hyperglycemia: Secondary | ICD-10-CM

## 2014-04-25 MED ORDER — ONETOUCH DELICA LANCETS 33G MISC: 1.0000 [IU] | Freq: Two times a day (BID) | Status: DC

## 2014-04-25 MED ORDER — GLUCOSE BLOOD VI STRP: ORAL_STRIP | Status: DC

## 2014-04-25 NOTE — Progress Notes (Signed)
   Subjective:    Patient ID: Eliezer MccoyDebra Chamber, female    DOB: 09-29-1955, 59 y.o.   MRN: [id number]  HPI Patient presents today for follow up of HTN and DM. She was seen 04/16/14 with elevated BP and was started on amlodipine 5 mg. She returns today with BP log consisting of 10 daily readings. Readings range 125-191/70-106. Five readings less than 150 systolic and all but 1 reading less than 90 diastolic.  The patient has lost 5 pounds in the last 10 days and is tolerating medications without side effects. She reports making healthier food choices, eating fewer carbs and eating more vegetables. She is rarely drinking soda.  She has had some loose stools, thinks they may be related to something she ate over the weekend.   She was unable to get all of the supplies needed for diabetic testing, needs another order for lancets and strips according to her pharmacy.    Review of Systems     Objective:   Physical Exam  Constitutional: She is oriented to person, place, and time. She appears well-developed and well-nourished.  Obese   HENT:  Head: Normocephalic and atraumatic.  Eyes: Conjunctivae are normal.  Cardiovascular: Normal rate.   Pulmonary/Chest: Effort normal.  Musculoskeletal: Normal range of motion.  Neurological: She is alert and oriented to person, place, and time.  Skin: Skin is warm and dry.  Psychiatric: She has a normal mood and affect. Her behavior is normal. Judgment and thought content normal.  Vitals reviewed.  BP 138/82 Weight 320    Assessment & Plan:  1. Type 2 diabetes mellitus with hyperglycemia - glucose blood (ONE TOUCH ULTRA TEST) test strip; Use as instructed  Dispense: 100 each; Refill: 12 - ONETOUCH DELICA LANCETS 33G MISC; 1 Units by Does not apply route 2 (two) times daily.  Dispense: 100 each; Refill: 12  2. Essential hypertension -significantly improved -continue periodic monitoring at home - letter faxed to [employer] as requested by patient  -  follow up 05/14/14 for CPE as scheduled. Emi Belfasteborah B. Aarik Blank, FNP-BC  [facility], [facility]  04/26/2014 10:44 PM

## 2014-05-14 ENCOUNTER — Encounter: Payer: Self-pay | Admitting: Family Medicine

## 2014-05-14 ENCOUNTER — Ambulatory Visit (INDEPENDENT_AMBULATORY_CARE_PROVIDER_SITE_OTHER): Payer: BLUE CROSS/BLUE SHIELD | Admitting: Family Medicine

## 2014-05-14 VITALS — BP 150/78 | HR 72 | Temp 98.5°F | Resp 16 | Ht 67.25 in | Wt 319.2 lb

## 2014-05-14 DIAGNOSIS — Z1239 Encounter for other screening for malignant neoplasm of breast: Secondary | ICD-10-CM | POA: Diagnosis not present

## 2014-05-14 DIAGNOSIS — E1165 Type 2 diabetes mellitus with hyperglycemia: Secondary | ICD-10-CM

## 2014-05-14 DIAGNOSIS — Z13220 Encounter for screening for lipoid disorders: Secondary | ICD-10-CM | POA: Diagnosis not present

## 2014-05-14 DIAGNOSIS — Z0000 Encounter for general adult medical examination without abnormal findings: Secondary | ICD-10-CM | POA: Diagnosis not present

## 2014-05-14 LAB — LIPID PANEL
CHOLESTEROL: 193 mg/dL (ref 0–200)
HDL: 46 mg/dL (ref >=46)
LDL Cholesterol: 123 mg/dL — ABNORMAL HIGH (ref 0–99)
TRIGLYCERIDES: 118 mg/dL (ref <150)
Total CHOL/HDL Ratio: 4.2 Ratio
VLDL: 24 mg/dL (ref 0–40)

## 2014-05-14 LAB — BASIC METABOLIC PANEL
BUN: 11 mg/dL (ref 6–23)
CALCIUM: 8.9 mg/dL (ref 8.4–10.5)
CO2: 25 mEq/L (ref 19–32)
Chloride: 102 mEq/L (ref 96–112)
Creat: 0.73 mg/dL (ref 0.50–1.10)
Glucose, Bld: 134 mg/dL — ABNORMAL HIGH (ref 70–99)
Potassium: 4.1 mEq/L (ref 3.5–5.3)
SODIUM: 140 meq/L (ref 135–145)

## 2014-05-14 NOTE — Progress Notes (Signed)
   Subjective:    Patient ID: Eliezer MccoyDebra Spain, female    DOB: June 21, 1955, 59 y.o.   MRN: [id number]  HPI    Review of Systems  Constitutional: Positive for activity change.  HENT: Negative.   Eyes: Negative.   Cardiovascular: Negative.   Gastrointestinal: Negative.   Endocrine: Negative.   Genitourinary: Negative.   Musculoskeletal: Negative.   Skin: Negative.   Allergic/Immunologic: Negative.   Neurological: Negative.   Hematological: Negative.   Psychiatric/Behavioral: Negative.        Objective:   Physical Exam        Assessment & Plan:

## 2014-05-14 NOTE — Patient Instructions (Signed)
Follow up in 3 months- let me know if blood sugar levels consistently over 200 Add aspirin 81 mg daily  Keep up the good work with healthy food choices and increased exercise

## 2014-05-14 NOTE — Progress Notes (Signed)
Subjective:    Patient ID: Olivia Middleton, female    DOB: Nov 16, 1955, 59 y.o.   MRN: [id number]  HPI Patient presents today for CPE Last CPE- several years PAP- hysterectomy Mammo- ?2 years Colonoscopy- patient reports having since she turned 50 (?2013)with no polyps found Dental- regular Eye- regular Exercise- intermittent  Patient was diagnosed with DM2 and htn 1 month ago. She is tolerating medications without side effects.Patient has had more energy. She has been watching her diet and trying to increase her activity level. She has lost 6 pounds. She has home glucometer but has not been testing her blood sugar. She reports she knows how, but just has not started.   Past Medical History  Diagnosis Date  . Hypertension   . Diabetes mellitus   . Seasonal allergies    Past Surgical History  Procedure Laterality Date  . Abdominal hysterectomy    . Knee arthroscopy    . Tubal ligation    . Nose surgery     Family History  Problem Relation Age of Onset  . Hypertension Father    History  Substance Use Topics  . Smoking status: Former Games developer  . Smokeless tobacco: Not on file  . Alcohol Use: No     Review of Systems  Constitutional: Positive for activity change (increased activity).  HENT: Negative.   Eyes: Negative.   Respiratory: Negative.   Cardiovascular: Negative.   Gastrointestinal: Negative.   Endocrine: Negative.   Genitourinary: Negative.   Musculoskeletal: Negative.   Skin: Negative.   Allergic/Immunologic: Negative.   Neurological: Negative.   Hematological: Negative.   Psychiatric/Behavioral: Negative.        Objective:   Physical Exam  Constitutional: She is oriented to person, place, and time. She appears well-developed and well-nourished.  HENT:  Head: Normocephalic and atraumatic.  Right Ear: Tympanic membrane, external ear and ear canal normal.  Left Ear: Tympanic membrane, external ear and ear canal normal.  Nose: Nose normal.    Mouth/Throat: Oropharynx is clear and moist. No oropharyngeal exudate.  Eyes: Conjunctivae are normal. Pupils are equal, round, and reactive to light.  Neck: Normal range of motion. Neck supple. No JVD present. No thyromegaly present.  Cardiovascular: Normal rate, regular rhythm, normal heart sounds and intact distal pulses.   Pulmonary/Chest: Effort normal and breath sounds normal. Right breast exhibits no inverted nipple, no mass, no nipple discharge, no skin change and no tenderness. Left breast exhibits no inverted nipple, no mass, no nipple discharge, no skin change and no tenderness. Breasts are symmetrical.  Abdominal: Soft. Bowel sounds are normal.  Musculoskeletal: Normal range of motion.  Lymphadenopathy:    She has no cervical adenopathy.  Neurological: She is alert and oriented to person, place, and time.  Skin: Skin is warm and dry.  Psychiatric: She has a normal mood and affect. Her behavior is normal. Judgment and thought content normal.  Vitals reviewed. BP 150/78 mmHg  Pulse 72  Temp(Src) 98.5 F (36.9 C) (Oral)  Resp 16  Ht 5' 7.25" (1.708 m)  Wt 319 lb 3.2 oz (144.788 kg)  BMI 49.63 kg/m2  SpO2 96% Blood pressure recheck- 142/76     Assessment & Plan:  1. Annual physical exam  2. Type 2 diabetes mellitus with hyperglycemia - HM Diabetes Foot Exam - Basic metabolic panel -Encouraged patient to start testing blood sugars several times a week at different times- fasting, post prandial -Currently on metformin 500 mg po bid, will increase if glucose elevated today on  fasting BMP. -Celebrated patient's 6 pound weight loss and encouraged continued healthy choices and gradual increased activity. - Follow up in 3 months with goal of 10 pound weight loss - add ASA 81 mg  3. Screening for lipid disorders - Lipid panel  4. Morbid obesity - Vitamin D, 25-hydroxy  5. Screening for breast cancer - MM Digital Screening; Future   Emi Belfasteborah B. Gessner, FNP-BC  [facility], [facility]  05/16/2014 8:44 AM

## 2014-05-15 ENCOUNTER — Other Ambulatory Visit: Payer: Self-pay | Admitting: Family Medicine

## 2014-05-15 DIAGNOSIS — E559 Vitamin D deficiency, unspecified: Secondary | ICD-10-CM

## 2014-05-15 LAB — VITAMIN D 25 HYDROXY (VIT D DEFICIENCY, FRACTURES): VIT D 25 HYDROXY: 24 ng/mL — AB (ref 30–100)

## 2014-05-15 MED ORDER — CHOLECALCIFEROL 1.25 MG (50000 UT) PO CAPS: ORAL_CAPSULE | ORAL | Status: DC

## 2014-06-04 ENCOUNTER — Telehealth: Payer: Self-pay

## 2014-06-04 DIAGNOSIS — E669 Obesity, unspecified: Principal | ICD-10-CM

## 2014-06-04 DIAGNOSIS — E1169 Type 2 diabetes mellitus with other specified complication: Secondary | ICD-10-CM

## 2014-06-04 NOTE — Telephone Encounter (Signed)
Pt wants a new script called into for her metFORMIN (GLUCOPHAGE) 1000 MG tablet [[id number]]. She saw Leone PayorGessner and she increased her dosage to 2 pills a day, so she is going through them faster than her current script will allow. Please advise [phone]

## 2014-06-05 MED ORDER — METFORMIN HCL 1000 MG PO TABS: 1000.0000 mg | ORAL_TABLET | Freq: Two times a day (BID) | ORAL | Status: DC

## 2014-06-05 NOTE — Telephone Encounter (Signed)
Sent in new Rx per Debbie's notes on lab results and notified pt on VM

## 2014-06-05 NOTE — Addendum Note (Signed)
Addended by: Sheppard PlumberBRIGGS, Renika Shiflet A on: 06/05/2014 05:18 PM   Modules accepted: Orders

## 2014-08-13 ENCOUNTER — Ambulatory Visit (INDEPENDENT_AMBULATORY_CARE_PROVIDER_SITE_OTHER): Payer: BC Managed Care – PPO | Admitting: Family Medicine

## 2014-08-13 ENCOUNTER — Encounter: Payer: Self-pay | Admitting: Family Medicine

## 2014-08-13 VITALS — BP 144/89 | HR 89 | Temp 98.3°F | Resp 16 | Ht 66.75 in | Wt 304.2 lb

## 2014-08-13 DIAGNOSIS — E669 Obesity, unspecified: Secondary | ICD-10-CM | POA: Diagnosis not present

## 2014-08-13 DIAGNOSIS — E1169 Type 2 diabetes mellitus with other specified complication: Secondary | ICD-10-CM

## 2014-08-13 DIAGNOSIS — I10 Essential (primary) hypertension: Secondary | ICD-10-CM

## 2014-08-13 DIAGNOSIS — E119 Type 2 diabetes mellitus without complications: Secondary | ICD-10-CM

## 2014-08-13 DIAGNOSIS — R1011 Right upper quadrant pain: Secondary | ICD-10-CM | POA: Diagnosis not present

## 2014-08-13 LAB — POCT GLYCOSYLATED HEMOGLOBIN (HGB A1C): HEMOGLOBIN A1C: 6

## 2014-08-13 MED ORDER — METFORMIN HCL 1000 MG PO TABS: 1000.0000 mg | ORAL_TABLET | Freq: Two times a day (BID) | ORAL | Status: DC

## 2014-08-13 NOTE — Progress Notes (Signed)
   Subjective:    Patient ID: Olivia Middleton, female    DOB: May 15, 1955, 59 y.o.   MRN: [id number]  HPI Patient presents today for 3 month follow up of DM type 2. She is accompanied by her husband. She continues to watch her diet carefully and has lost 15 pounds in the last 3 months. She checks her glucose periodically with readings in the 120s after eating. She is walking more and feeling more energetic and has better exercise tolerance.  She has noticed several episodes of diarrhea, nausea and right upper quadrant pain over the last several weeks. The episodes have come and gone and have been worse with eating dairy and heavy, greasy foods. Following the most recent episode last week, she was very sore in her right upper quadrant for several days. She had an abdominal ultrasound many years ago and believes it was negative.    Review of Systems No chest pain, no SOB, decreased swelling in feet and ankles with weight loss.     Objective:   Physical Exam Physical Exam  Constitutional: Oriented to person, place, and time. Obese.  HENT:  Head: Normocephalic and atraumatic.  Eyes: Conjunctivae are normal.  Neck: Normal range of motion. Neck supple.  Cardiovascular: Normal rate, regular rhythm and normal heart sounds.   Pulmonary/Chest: Effort normal and breath sounds normal.  Abdominal: obese abdomen with normal bowel sounds, non distended, non tender.  Musculoskeletal: Normal range of motion.  Neurological: Alert and oriented to person, place, and time.  Skin: Skin is warm and dry.  Psychiatric: Normal mood and affect. Behavior is normal. Judgment and thought content normal.  Vitals reviewed.  Results for orders placed or performed in visit on 08/13/14  POCT glycosylated hemoglobin (Hb A1C)  Result Value Ref Range   Hemoglobin A1C 6.0    Wt Readings from Last 3 Encounters:  08/13/14 304 lb 3.2 oz (137.984 kg)  05/14/14 319 lb 3.2 oz (144.788 kg)  04/16/14 325 lb (147.419 kg)  BP  144/89 mmHg  Pulse 89  Temp(Src) 98.3 F (36.8 C) (Oral)  Resp 16  Ht 5' 6.75" (1.695 m)  Wt 304 lb 3.2 oz (137.984 kg)  BMI 48.03 kg/m2  SpO2 99%    Diabetic Foot Exam - Simple   Simple Foot Form  Diabetic Foot exam was performed with the following findings:  Yes 08/13/2014  8:21 AM  Visual Inspection  Sensation Testing  Pulse Check  Comments      Assessment & Plan:  1. Diabetes mellitus type 2 in obese - POCT glycosylated hemoglobin (Hb A1C) - HM Diabetes Foot Exam - metFORMIN (GLUCOPHAGE) 1000 MG tablet; Take 1 tablet (1,000 mg total) by mouth 2 (two) times daily with a meal.  Dispense: 60 tablet; Refill: 5  2. Morbid obesity - patient with 21 pound weight loss in 4 months- provided encouragement for continued healthy food choices and regular exercise - Set weight loss goal for 15 pounds for next 3 months  3. Essential hypertension - continue current medication- amlodipine 5 mg po qd  4. Abdominal pain, right upper quadrant - suspicious for gall stones - US Abdomen Complete; Future - if further severe pain, encouraged patient to go to [facility] walk in or ER.  Olean Ree, FNP-BC  [facility], [facility]  08/13/2014 9:42 PM

## 2014-08-21 ENCOUNTER — Ambulatory Visit
Admission: RE | Admit: 2014-08-21 | Discharge: 2014-08-21 | Disposition: A | Discharge status: Home / Self Care | Payer: BC Managed Care – PPO | Source: Ambulatory Visit | Attending: Family Medicine | Admitting: Family Medicine

## 2014-08-21 DIAGNOSIS — R1011 Right upper quadrant pain: Secondary | ICD-10-CM

## 2014-09-02 ENCOUNTER — Other Ambulatory Visit: Payer: Self-pay | Admitting: Family Medicine

## 2014-10-12 ENCOUNTER — Other Ambulatory Visit: Payer: Self-pay | Admitting: Family Medicine

## 2015-02-11 ENCOUNTER — Encounter: Payer: Self-pay | Admitting: Family Medicine

## 2015-02-11 ENCOUNTER — Ambulatory Visit (INDEPENDENT_AMBULATORY_CARE_PROVIDER_SITE_OTHER): Payer: BC Managed Care – PPO | Admitting: Family Medicine

## 2015-02-11 VITALS — BP 150/82 | HR 85 | Temp 97.7°F | Resp 16 | Ht 67.0 in | Wt 290.0 lb

## 2015-02-11 DIAGNOSIS — I10 Essential (primary) hypertension: Secondary | ICD-10-CM

## 2015-02-11 DIAGNOSIS — E669 Obesity, unspecified: Secondary | ICD-10-CM | POA: Diagnosis not present

## 2015-02-11 DIAGNOSIS — Z23 Encounter for immunization: Secondary | ICD-10-CM | POA: Diagnosis not present

## 2015-02-11 DIAGNOSIS — E1169 Type 2 diabetes mellitus with other specified complication: Secondary | ICD-10-CM

## 2015-02-11 DIAGNOSIS — E119 Type 2 diabetes mellitus without complications: Secondary | ICD-10-CM | POA: Diagnosis not present

## 2015-02-11 LAB — BASIC METABOLIC PANEL
BUN: 13 mg/dL (ref 7–25)
CALCIUM: 9.5 mg/dL (ref 8.6–10.4)
CO2: 24 mmol/L (ref 20–31)
CREATININE: 0.68 mg/dL (ref 0.50–1.05)
Chloride: 101 mmol/L (ref 98–110)
Glucose, Bld: 103 mg/dL — ABNORMAL HIGH (ref 65–99)
Potassium: 4.2 mmol/L (ref 3.5–5.3)
Sodium: 139 mmol/L (ref 135–146)

## 2015-02-11 LAB — MICROALBUMIN, URINE: MICROALB UR: 1.2 mg/dL

## 2015-02-11 LAB — HEMOGLOBIN A1C
HEMOGLOBIN A1C: 6.2 % — AB (ref <5.7)
MEAN PLASMA GLUCOSE: 131 mg/dL — AB (ref <117)

## 2015-02-11 NOTE — Patient Instructions (Signed)
Please call the breast center to set up your mammogram- [phone]

## 2015-02-11 NOTE — Progress Notes (Signed)
   Subjective:    Patient ID: Olivia Middleton, female    DOB: Oct 02, 1955, 59 y.o.   MRN: [id number]  HPI This is a pleasant 59 yo female who presents today for follow up of DM, HTN. Blood sugars running normal. Checks occasionally. She feels better and continues to watch her diet. Hasn't been exercising much lately due to weather. Energy level good. Has some loose stools with metformin, otherwise, no side effects from medications.   Past Medical History  Diagnosis Date  . Hypertension   . Diabetes mellitus   . Seasonal allergies    Past Surgical History  Procedure Laterality Date  . Abdominal hysterectomy    . Knee arthroscopy    . Tubal ligation    . Nose surgery     Family History  Problem Relation Age of Onset  . Hypertension Father    Social History  Substance Use Topics  . Smoking status: Former Games developermoker  . Smokeless tobacco: Not on file  . Alcohol Use: No    Review of Systems No chest pain or SOB, occasional ankle swelling at end of day.     Objective:   Physical Exam Physical Exam  Constitutional: Oriented to person, place, and time. She appears well-developed and well-nourished.  HENT:  Head: Normocephalic and atraumatic.  Eyes: Conjunctivae are normal.  Neck: Normal range of motion. Neck supple.  Cardiovascular: Normal rate, regular rhythm and normal heart sounds.   Pulmonary/Chest: Effort normal and breath sounds normal.  Musculoskeletal: Normal range of motion.  Neurological: Alert and oriented to person, place, and time.  Skin: Skin is warm and dry.  Psychiatric: Normal mood and affect. Behavior is normal. Judgment and thought content normal.  Vitals reviewed. BP 171/103 mmHg  Pulse 85  Temp(Src) 97.7 F (36.5 C)  Resp 16  Ht 5\' 7"  (1.702 m)  Wt 290 lb (131.543 kg)  BMI 45.41 kg/m2 Wt Readings from Last 3 Encounters:  02/11/15 290 lb (131.543 kg)  08/13/14 304 lb 3.2 oz (137.984 kg)  05/14/14 319 lb 3.2 oz (144.788 kg)  Recheck BP 150/82       Assessment & Plan:  1. Diabetes mellitus type 2 in obese (HCC) - continue current meds - Basic metabolic panel - Hemoglobin A1c - Microalbumin, urine  2. Essential hypertension - BP a little high when first checked, then came down considerably after sitting for a few minutes - Basic metabolic panel  3. Need for Tdap vaccination - Tdap vaccine greater than or equal to 7yo IM  4. Morbid obesity due to excess calories (HCC) - Celebrated patient's 35 pound weight loss over last 20 months! - Discussed long term goal of 185 pounds and discussed continued healthy food choices, portion control, encouraged increased physical activity  - Follow up in 6 months for CPE- Patient has not had mammo ordered 3/16 (she states she was never called), I gave [facility] and she will call and make an appointment.  Olean Reeeborah Ramsha Lonigro, FNP-BC  [facility], [facility]  02/11/2015 9:09 AM

## 2015-02-12 ENCOUNTER — Encounter: Payer: Self-pay | Admitting: Family Medicine

## 2015-03-11 ENCOUNTER — Ambulatory Visit
Admission: RE | Admit: 2015-03-11 | Discharge: 2015-03-11 | Disposition: A | Discharge status: Home / Self Care | Payer: BC Managed Care – PPO | Source: Ambulatory Visit | Attending: Family Medicine | Admitting: Family Medicine

## 2015-03-11 ENCOUNTER — Ambulatory Visit: Payer: BC Managed Care – PPO

## 2015-03-11 DIAGNOSIS — Z1239 Encounter for other screening for malignant neoplasm of breast: Secondary | ICD-10-CM

## 2015-03-22 ENCOUNTER — Encounter: Payer: Self-pay | Admitting: Family Medicine

## 2015-03-27 ENCOUNTER — Encounter: Payer: Self-pay | Admitting: Family Medicine

## 2015-03-30 ENCOUNTER — Other Ambulatory Visit: Payer: Self-pay | Admitting: Family Medicine

## 2015-04-24 ENCOUNTER — Other Ambulatory Visit: Payer: Self-pay | Admitting: Family Medicine

## 2015-07-11 ENCOUNTER — Ambulatory Visit (INDEPENDENT_AMBULATORY_CARE_PROVIDER_SITE_OTHER): Payer: BC Managed Care – PPO | Admitting: Family Medicine

## 2015-07-11 VITALS — BP 142/80 | HR 82 | Temp 98.8°F | Resp 16 | Ht 68.0 in | Wt 280.8 lb

## 2015-07-11 DIAGNOSIS — E669 Obesity, unspecified: Secondary | ICD-10-CM | POA: Diagnosis not present

## 2015-07-11 DIAGNOSIS — I10 Essential (primary) hypertension: Secondary | ICD-10-CM | POA: Diagnosis not present

## 2015-07-11 DIAGNOSIS — Z0000 Encounter for general adult medical examination without abnormal findings: Secondary | ICD-10-CM

## 2015-07-11 DIAGNOSIS — E119 Type 2 diabetes mellitus without complications: Secondary | ICD-10-CM

## 2015-07-11 DIAGNOSIS — E559 Vitamin D deficiency, unspecified: Secondary | ICD-10-CM

## 2015-07-11 DIAGNOSIS — E1169 Type 2 diabetes mellitus with other specified complication: Secondary | ICD-10-CM

## 2015-07-11 LAB — CBC
HEMATOCRIT: 46.8 % — AB (ref 35.0–45.0)
Hemoglobin: 15.5 g/dL (ref 11.7–15.5)
MCH: 29.8 pg (ref 27.0–33.0)
MCHC: 33.1 g/dL (ref 32.0–36.0)
MCV: 90 fL (ref 80.0–100.0)
MPV: 10.2 fL (ref 7.5–12.5)
Platelets: 296 10*3/uL (ref 140–400)
RBC: 5.2 MIL/uL — ABNORMAL HIGH (ref 3.80–5.10)
RDW: 14.2 % (ref 11.0–15.0)
WBC: 7.1 10*3/uL (ref 3.8–10.8)

## 2015-07-11 LAB — LIPID PANEL
Cholesterol: 198 mg/dL (ref 125–200)
HDL: 52 mg/dL (ref >=46)
LDL CALC: 127 mg/dL (ref <130)
TRIGLYCERIDES: 95 mg/dL (ref <150)
Total CHOL/HDL Ratio: 3.8 Ratio (ref <=5.0)
VLDL: 19 mg/dL (ref <30)

## 2015-07-11 LAB — COMPLETE METABOLIC PANEL WITH GFR
ALT: 24 U/L (ref 6–29)
AST: 21 U/L (ref 10–35)
Albumin: 4.1 g/dL (ref 3.6–5.1)
Alkaline Phosphatase: 75 U/L (ref 33–130)
BUN: 16 mg/dL (ref 7–25)
CHLORIDE: 103 mmol/L (ref 98–110)
CO2: 22 mmol/L (ref 20–31)
Calcium: 9 mg/dL (ref 8.6–10.4)
Creat: 0.79 mg/dL (ref 0.50–1.05)
GFR, EST NON AFRICAN AMERICAN: 82 mL/min (ref >=60)
Glucose, Bld: 124 mg/dL — ABNORMAL HIGH (ref 65–99)
POTASSIUM: 4.2 mmol/L (ref 3.5–5.3)
Sodium: 140 mmol/L (ref 135–146)
Total Bilirubin: 0.5 mg/dL (ref 0.2–1.2)
Total Protein: 7.2 g/dL (ref 6.1–8.1)

## 2015-07-11 LAB — POC MICROSCOPIC URINALYSIS (UMFC): Mucus: ABSENT

## 2015-07-11 LAB — POCT URINALYSIS DIP (MANUAL ENTRY)
BILIRUBIN UA: NEGATIVE
BILIRUBIN UA: NEGATIVE
Glucose, UA: NEGATIVE
LEUKOCYTES UA: NEGATIVE
Nitrite, UA: NEGATIVE
PH UA: 5
Protein Ur, POC: NEGATIVE
SPEC GRAV UA: 1.02
Urobilinogen, UA: 0.2

## 2015-07-11 LAB — HEMOGLOBIN A1C
HEMOGLOBIN A1C: 6.2 % — AB (ref <5.7)
MEAN PLASMA GLUCOSE: 131 mg/dL

## 2015-07-11 LAB — TSH: TSH: 0.53 m[IU]/L

## 2015-07-11 LAB — MICROALBUMIN, URINE: MICROALB UR: 0.9 mg/dL

## 2015-07-11 MED ORDER — LISINOPRIL 5 MG PO TABS: 5.0000 mg | ORAL_TABLET | Freq: Every day | ORAL | Status: DC

## 2015-07-11 NOTE — Patient Instructions (Addendum)
Please try to take 1/2 tablet of metformin twice a day. Let me know if your diarrhea does not improve  Please schedule your eye exam and have the results faxed to our office  Stop amlodipine and start lisinopril- check your blood pressure and keep a log, if after 4 weeks your blood pressure is consisently below 140 on the top and 90 on the bottom stay on lisinopril. If it is running higher than that, let me know and we will add the HCTZ.      IF you received an x-ray today, you will receive an invoice from [facility]. Please contact [facility] at [phone] with questions or concerns regarding your invoice.   IF you received labwork today, you will receive an invoice from [facility]. Please contact [facility] at [phone] with questions or concerns regarding your invoice.   Our billing staff will not be able to assist you with questions regarding bills from these companies.  You will be contacted with the lab results as soon as they are available. The fastest way to get your results is to activate your My Chart account. Instructions are located on the last page of this paperwork. If you have not heard from us regarding the results in 2 weeks, please contact this office.

## 2015-07-11 NOTE — Progress Notes (Signed)
Subjective:    Patient ID: Eliezer Mccoy, female    DOB: February 08, 1956, 60 y.o.   MRN: [id number]  HPI This is a pleasant 60 yo female who presents today for CPE. She has been working on diet and has lost 10 pounds since 12/16. 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   Last CPE- 05/14/14 Mammo- 03/11/2015 Pap- hysterectomy Colonoscopy- about 10 years ago when she turned 50, no polyps Tdap- 02/11/15 Flu- annual Eye- due Dental- annual Exercise- walking, somewhat limited by frequent bowel movements.   Was on lisinopril/HCTZ and would like to change back from amlodipine.  Past Medical History  Diagnosis Date  . Hypertension   . Diabetes mellitus   . Seasonal allergies    Past Surgical History  Procedure Laterality Date  . Abdominal hysterectomy    . Knee arthroscopy    . Tubal ligation    . Nose surgery     Family History  Problem Relation Age of Onset  . Hypertension Father    Social History  Substance Use Topics  . Smoking status: Former Games developer  . Smokeless tobacco: None  . Alcohol Use: No       Review of Systems  Constitutional: Negative.   HENT: Negative.   Eyes: Negative.   Respiratory: Negative.   Cardiovascular: Negative.   Gastrointestinal: Positive for diarrhea (worse with beef).  Endocrine: Negative.   Genitourinary: Negative.   Musculoskeletal: Negative.   Skin: Negative.   Allergic/Immunologic: Negative.   Neurological: Negative.   Hematological: Negative.   Psychiatric/Behavioral: Negative.        Objective:   Physical Exam Physical Exam  Constitutional: She is oriented to person, place, and time. She appears well-developed and well-nourished. No distress. Obese.  HENT:  Head: Normocephalic and atraumatic.  Right Ear: External ear normal.  Left Ear: External ear normal.  Nose: Nose normal.  Mouth/Throat: Oropharynx is clear and moist. No oropharyngeal exudate.  Eyes: Conjunctivae are normal.  Pupils are equal, round, and reactive to light.  Neck: Normal range of motion. Neck supple. No JVD present. No thyromegaly present.  Cardiovascular: Normal rate, regular rhythm, normal heart sounds and intact distal pulses.   Pulmonary/Chest: Effort normal and breath sounds normal. Right breast exhibits no inverted nipple, no mass, no nipple discharge, no skin change and no tenderness. Left breast exhibits no inverted nipple, no mass, no nipple discharge, no skin change and no tenderness. Breasts are symmetrical.  Abdominal: Soft. Bowel sounds are normal. She exhibits no distension and no mass. There is no tenderness. There is no rebound and no guarding.  Musculoskeletal: Normal range of motion. She exhibits no edema or tenderness.  Lymphadenopathy:    She has no cervical adenopathy.  Neurological: She is alert and oriented to person, place, and time. She has normal reflexes.  Skin: Skin is warm and dry. She is not diaphoretic.  Psychiatric: She has a normal mood and affect. Her behavior is normal. Judgment and thought content normal.  Vitals reviewed.  BP 142/80 mmHg  Pulse 82  Temp(Src) 98.8 F (37.1 C) (Oral)  Resp 16  Ht  (1.727 m)  Wt 280 lb 12.8 oz (127.37 kg)  BMI 42.71 kg/m2  SpO2 96% Wt Readings from Last 3 Encounters:  07/11/15 280 lb 12.8 oz (127.37 kg)  02/11/15 290 lb (131.543 kg)  08/13/14 304 lb 3.2 oz (137.984 kg)  04/16/14- 325 lb      Assessment & Plan:  1. Annual  physical exam -- Discussed and encouraged healthy lifestyle choices- adequate sleep, regular exercise, stress management and healthy food choices.   2. Diabetes mellitus type 2 in obese (HCC) - POCT urinalysis dipstick - POCT Microscopic Urinalysis ([facility]) - CBC - Lipid panel - TSH - Hemoglobin A1c - COMPLETE METABOLIC PANEL WITH GFR - Microalbumin, urine  3. Essential hypertension - CBC - TSH - COMPLETE METABOLIC PANEL WITH GFR - lisinopril (PRINIVIL,ZESTRIL) 5 MG tablet; Take 1 tablet (5  mg total) by mouth daily.  Dispense: 30 tablet; Refill: 5  4. Vitamin D deficiency - VITAMIN D 25 Hydroxy (Vit-D Deficiency, Fractures)  5. Diarrhea - will try to decrease metformin to 1/2 tab BID, if no improvement, will try XR  6. Morbid Obesity - patient has lost 45 pounds in last 15 months- celebrated with her! - discussed continued portion control, healthy food choices and increased activity  - follow up in 6 months [place] Reeeborah Gessner, FNP-BC  [facility]  07/11/2015 9:10 AM

## 2015-07-12 LAB — VITAMIN D 25 HYDROXY (VIT D DEFICIENCY, FRACTURES): VIT D 25 HYDROXY: 33 ng/mL (ref 30–100)

## 2015-08-12 ENCOUNTER — Encounter: Payer: BC Managed Care – PPO | Admitting: Family Medicine

## 2015-08-13 ENCOUNTER — Encounter: Payer: BC Managed Care – PPO | Admitting: Family Medicine

## 2016-01-07 ENCOUNTER — Other Ambulatory Visit: Payer: Self-pay

## 2016-01-07 DIAGNOSIS — I10 Essential (primary) hypertension: Secondary | ICD-10-CM

## 2016-01-07 MED ORDER — LISINOPRIL 5 MG PO TABS: 5.0000 mg | ORAL_TABLET | Freq: Every day | ORAL | 5 refills | Status: AC

## 2016-01-07 NOTE — Telephone Encounter (Signed)
07/11/15 last ov and labs

## 2016-04-14 IMAGING — US US ABDOMEN COMPLETE
1 series · 13 of 25 positions shown · non-contrast
Comparison: March 24, 2005

CLINICAL DATA: Intermittent upper abdominal pain, primarily in
right upper quadrant

EXAM:
ULTRASOUND ABDOMEN COMPLETE

[Series 1: us abdomen complete · 0.35mm/px · 13 of 59 slices shown]
[im 1/59]
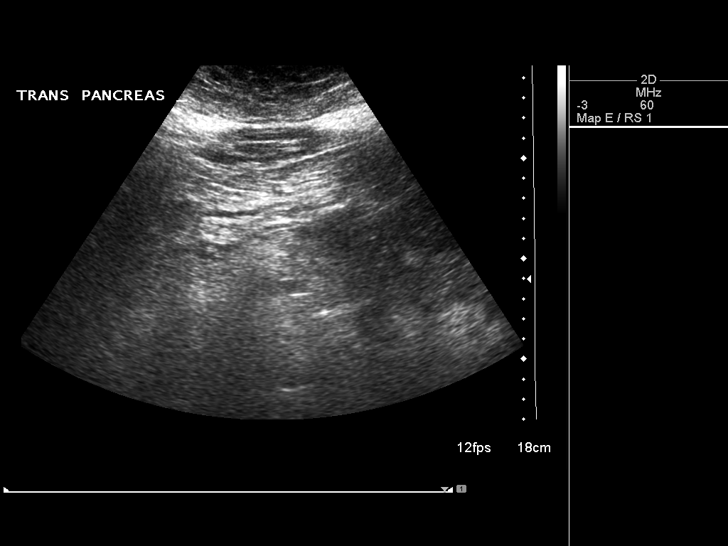
[im 5/59]
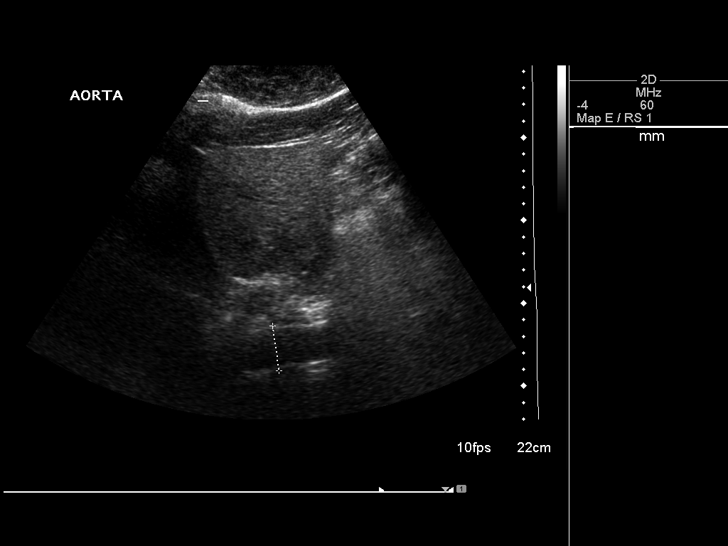
[im 10/59]
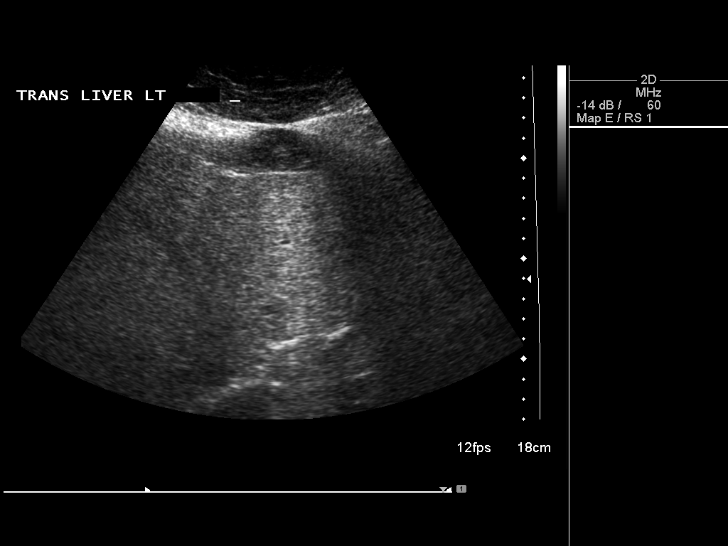
[im 15/59]
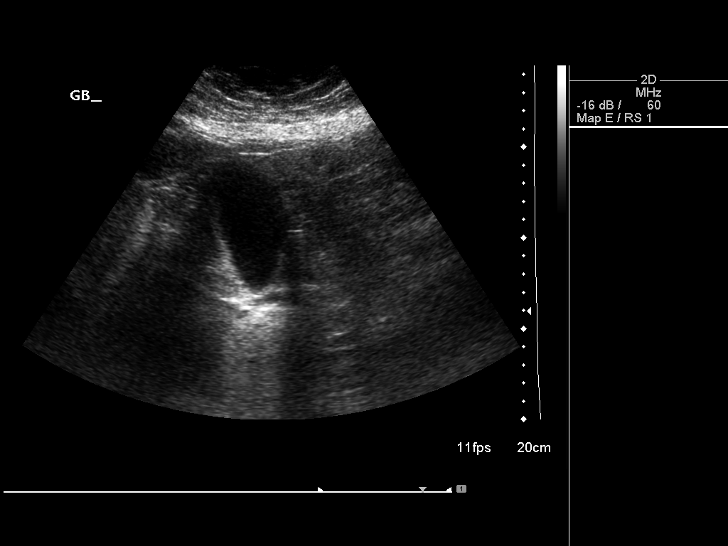
[im 20/59]
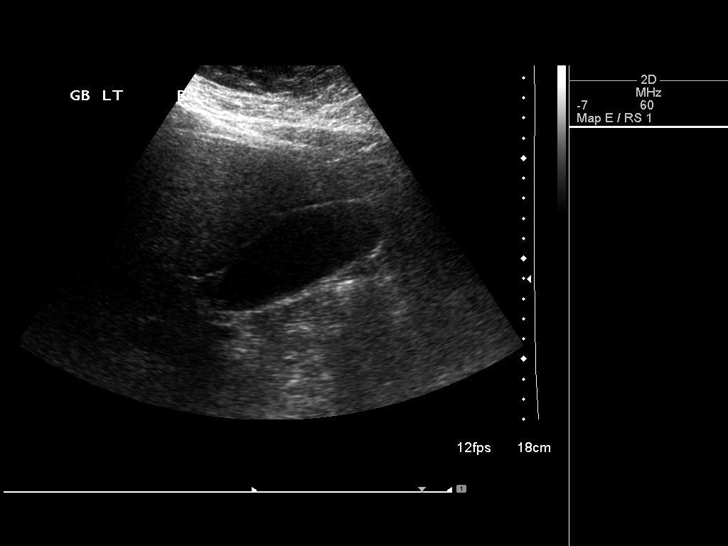
[im 25/59]
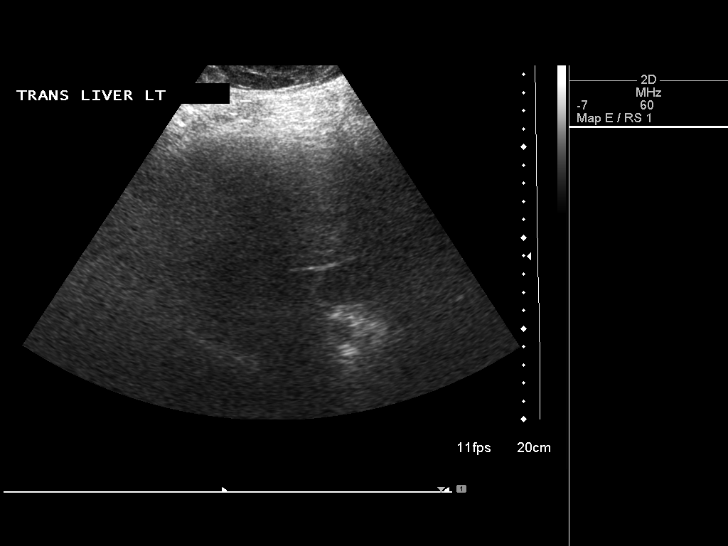
[im 30/59]
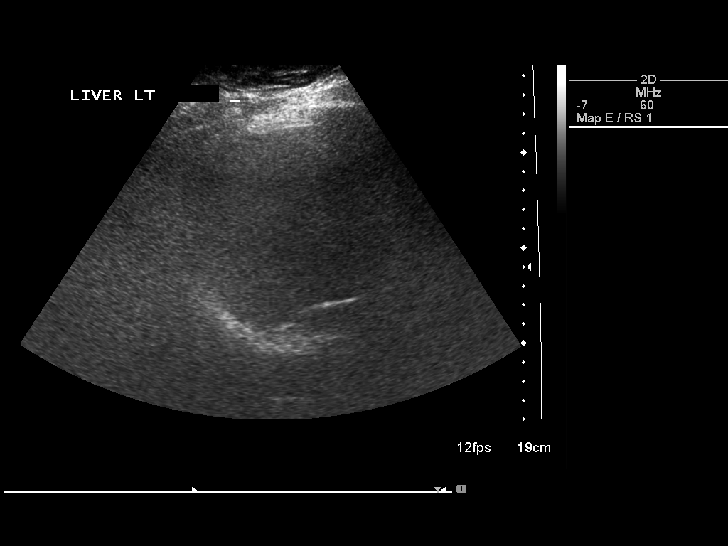
[im 34/59]
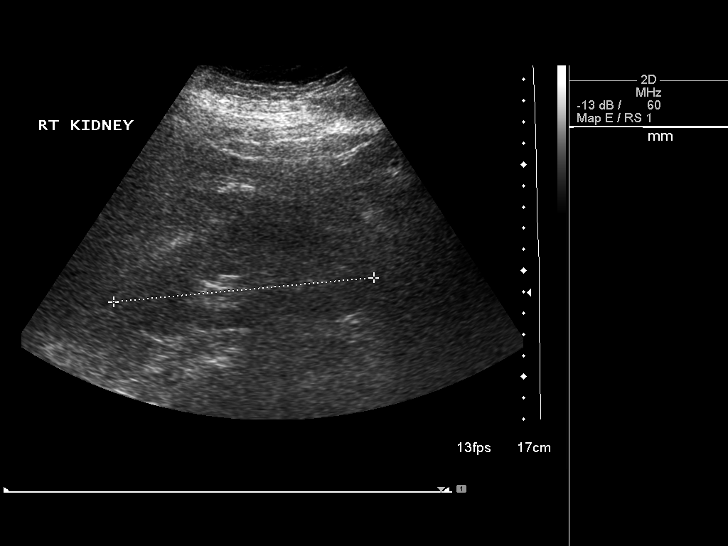
[im 39/59]
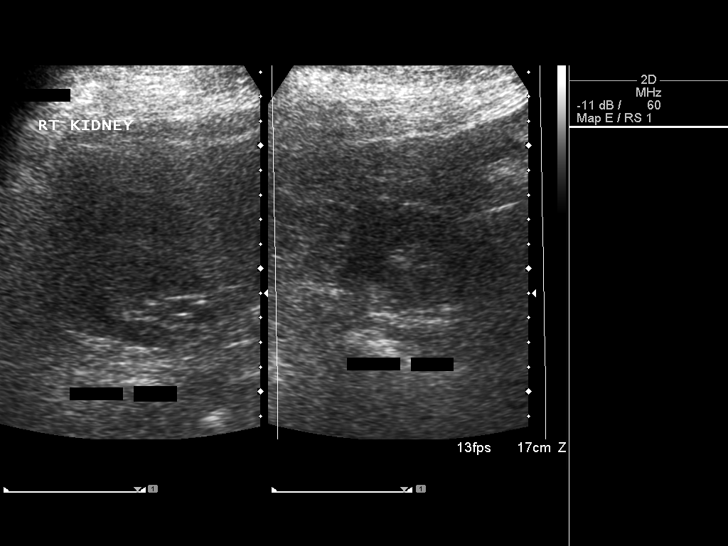
[im 44/59]
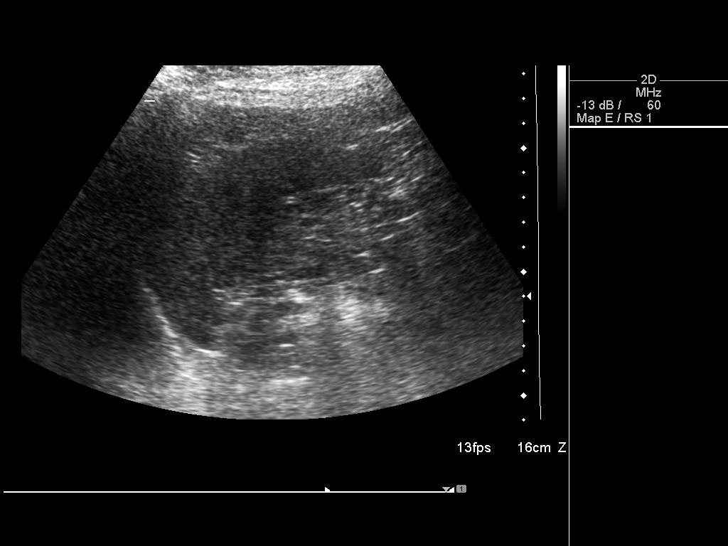
[im 49/59]
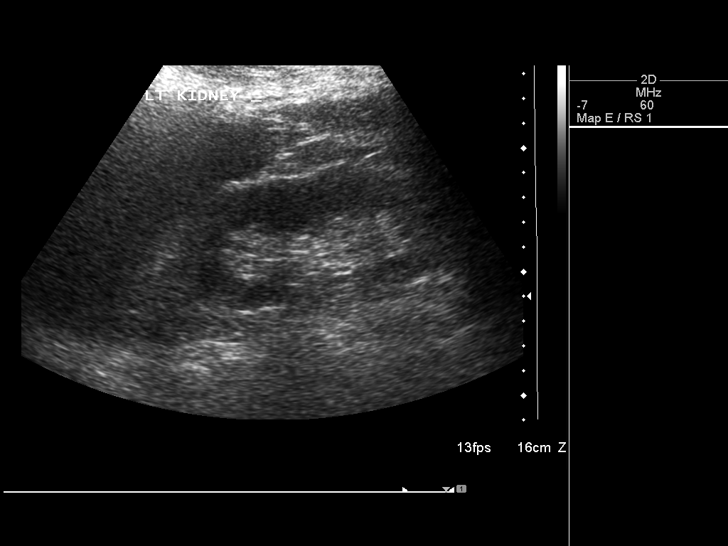
[im 54/59]
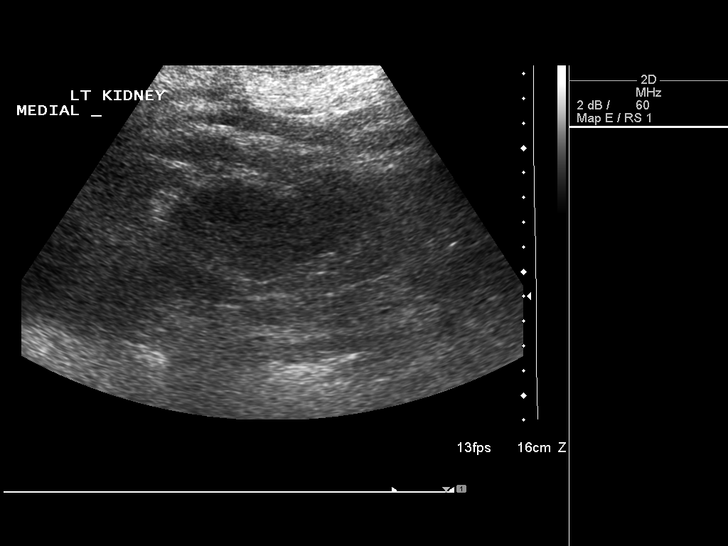
[im 59/59]
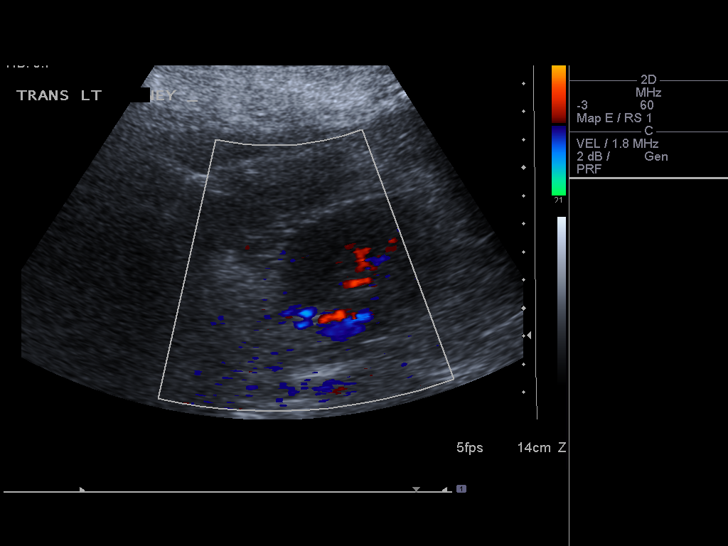

[13 of 25 positions shown; findings below may reference images not displayed]

FINDINGS: Gallbladder: No gallstones or wall thickening visualized. There is
no pericholecystic fluid. No sonographic Murphy sign noted.

Common bile duct: Diameter: 4 mm. There is no intrahepatic, common
hepatic, or common bile duct dilatation.

Liver: No focal lesion identified. Liver echogenicity is diffusely
increased.

IVC: No abnormality visualized. Note that portions of the inferior
vena cava are obscured by gas.

Pancreas: Visualized portion unremarkable. Portions of the pancreas
are obscured by gas.

Spleen: Size and appearance within normal limits.

Right Kidney: Length: 12.3 cm. Echogenicity within normal limits. No
mass or hydronephrosis visualized.

Left Kidney: Length: 13.1 cm. Echogenicity within normal limits. No
mass or hydronephrosis visualized.

Abdominal aorta: No aneurysm visualized.

Other findings: No demonstrable ascites.
IMPRESSION: Liver echogenicity is increased, a finding most likely due to
hepatic steatosis. While no focal liver lesions are identified, it
must be cautioned that the sensitivity of ultrasound for focal liver
lesions is diminished in this circumstance.

Portions of the inferior vena cava and pancreas are obscured by gas.
Visualized portions of the structures appear unremarkable.

Study otherwise unremarkable.

## 2016-04-29 ENCOUNTER — Other Ambulatory Visit: Payer: Self-pay | Admitting: Family Medicine

## 2016-04-29 DIAGNOSIS — Z1231 Encounter for screening mammogram for malignant neoplasm of breast: Secondary | ICD-10-CM

## 2016-05-14 ENCOUNTER — Ambulatory Visit
Admission: RE | Admit: 2016-05-14 | Discharge: 2016-05-14 | Disposition: A | Discharge status: Home / Self Care | Payer: BC Managed Care – PPO | Source: Ambulatory Visit | Attending: Family Medicine | Admitting: Family Medicine

## 2016-05-14 DIAGNOSIS — Z1231 Encounter for screening mammogram for malignant neoplasm of breast: Secondary | ICD-10-CM

## 2016-07-11 ENCOUNTER — Other Ambulatory Visit: Payer: Self-pay | Admitting: Family Medicine

## 2016-07-11 DIAGNOSIS — I10 Essential (primary) hypertension: Secondary | ICD-10-CM

## 2016-10-22 ENCOUNTER — Telehealth: Payer: Self-pay | Admitting: Family Medicine

## 2016-10-22 NOTE — Telephone Encounter (Signed)
I have mailed off pt's entire medical record from PCP [facility] over to [facility].

## 2017-01-28 ENCOUNTER — Ambulatory Visit (HOSPITAL_COMMUNITY)
Admission: EM | Admit: 2017-01-28 | Discharge: 2017-01-28 | Disposition: A | Discharge status: Home / Self Care | Payer: BC Managed Care – PPO | Source: Home / Self Care

## 2017-01-28 ENCOUNTER — Other Ambulatory Visit: Payer: Self-pay

## 2017-01-28 ENCOUNTER — Inpatient Hospital Stay (HOSPITAL_COMMUNITY)
Admission: EM | Admit: 2017-01-28 | Discharge: 2017-02-01 | DRG: 418 | Disposition: A | Discharge status: Home / Self Care | Payer: BC Managed Care – PPO | Attending: Family Medicine | Admitting: Family Medicine

## 2017-01-28 ENCOUNTER — Other Ambulatory Visit (HOSPITAL_COMMUNITY): Payer: BC Managed Care – PPO

## 2017-01-28 ENCOUNTER — Emergency Department (HOSPITAL_COMMUNITY): Payer: BC Managed Care – PPO

## 2017-01-28 ENCOUNTER — Encounter (HOSPITAL_COMMUNITY): Payer: Self-pay

## 2017-01-28 ENCOUNTER — Encounter (HOSPITAL_COMMUNITY): Payer: Self-pay | Admitting: *Deleted

## 2017-01-28 DIAGNOSIS — K8050 Calculus of bile duct without cholangitis or cholecystitis without obstruction: Secondary | ICD-10-CM | POA: Diagnosis not present

## 2017-01-28 DIAGNOSIS — Z881 Allergy status to other antibiotic agents status: Secondary | ICD-10-CM

## 2017-01-28 DIAGNOSIS — E119 Type 2 diabetes mellitus without complications: Secondary | ICD-10-CM | POA: Diagnosis present

## 2017-01-28 DIAGNOSIS — R8290 Unspecified abnormal findings in urine: Secondary | ICD-10-CM | POA: Diagnosis not present

## 2017-01-28 DIAGNOSIS — R1011 Right upper quadrant pain: Secondary | ICD-10-CM

## 2017-01-28 DIAGNOSIS — Z8249 Family history of ischemic heart disease and other diseases of the circulatory system: Secondary | ICD-10-CM

## 2017-01-28 DIAGNOSIS — E872 Acidosis, unspecified: Secondary | ICD-10-CM

## 2017-01-28 DIAGNOSIS — K8064 Calculus of gallbladder and bile duct with chronic cholecystitis without obstruction: Secondary | ICD-10-CM | POA: Diagnosis present

## 2017-01-28 DIAGNOSIS — K760 Fatty (change of) liver, not elsewhere classified: Secondary | ICD-10-CM | POA: Diagnosis present

## 2017-01-28 DIAGNOSIS — Z90710 Acquired absence of both cervix and uterus: Secondary | ICD-10-CM

## 2017-01-28 DIAGNOSIS — Z6841 Body Mass Index (BMI) 40.0 and over, adult: Secondary | ICD-10-CM | POA: Diagnosis not present

## 2017-01-28 DIAGNOSIS — Z7984 Long term (current) use of oral hypoglycemic drugs: Secondary | ICD-10-CM | POA: Diagnosis not present

## 2017-01-28 DIAGNOSIS — K8021 Calculus of gallbladder without cholecystitis with obstruction: Secondary | ICD-10-CM | POA: Diagnosis not present

## 2017-01-28 DIAGNOSIS — R198 Other specified symptoms and signs involving the digestive system and abdomen: Secondary | ICD-10-CM | POA: Diagnosis not present

## 2017-01-28 DIAGNOSIS — R6510 Systemic inflammatory response syndrome (SIRS) of non-infectious origin without acute organ dysfunction: Secondary | ICD-10-CM | POA: Diagnosis not present

## 2017-01-28 DIAGNOSIS — Z87891 Personal history of nicotine dependence: Secondary | ICD-10-CM | POA: Diagnosis not present

## 2017-01-28 DIAGNOSIS — I10 Essential (primary) hypertension: Secondary | ICD-10-CM | POA: Diagnosis present

## 2017-01-28 DIAGNOSIS — R945 Abnormal results of liver function studies: Secondary | ICD-10-CM | POA: Diagnosis not present

## 2017-01-28 DIAGNOSIS — R7989 Other specified abnormal findings of blood chemistry: Secondary | ICD-10-CM

## 2017-01-28 LAB — GLUCOSE, CAPILLARY: GLUCOSE-CAPILLARY: 160 mg/dL — AB (ref 65–99)

## 2017-01-28 LAB — COMPREHENSIVE METABOLIC PANEL
ALT: 206 U/L — ABNORMAL HIGH (ref 14–54)
ANION GAP: 10 (ref 5–15)
AST: 195 U/L — ABNORMAL HIGH (ref 15–41)
Albumin: 3.5 g/dL (ref 3.5–5.0)
Alkaline Phosphatase: 118 U/L (ref 38–126)
BILIRUBIN TOTAL: 6.7 mg/dL — AB (ref 0.3–1.2)
BUN: 13 mg/dL (ref 6–20)
CO2: 24 mmol/L (ref 22–32)
Calcium: 9.2 mg/dL (ref 8.9–10.3)
Chloride: 104 mmol/L (ref 101–111)
Creatinine, Ser: 0.98 mg/dL (ref 0.44–1.00)
GFR calc Af Amer: >60 mL/min (ref >60)
Glucose, Bld: 139 mg/dL — ABNORMAL HIGH (ref 65–99)
POTASSIUM: 3.8 mmol/L (ref 3.5–5.1)
Sodium: 138 mmol/L (ref 135–145)
TOTAL PROTEIN: 7.5 g/dL (ref 6.5–8.1)

## 2017-01-28 LAB — URINALYSIS, ROUTINE W REFLEX MICROSCOPIC
Glucose, UA: NEGATIVE mg/dL
Ketones, ur: NEGATIVE mg/dL
LEUKOCYTES UA: NEGATIVE
NITRITE: NEGATIVE
Protein, ur: NEGATIVE mg/dL
Renal Epithelial: 1
SPECIFIC GRAVITY, URINE: 1.016 (ref 1.005–1.030)
pH: 5 (ref 5.0–8.0)

## 2017-01-28 LAB — CBC
HEMATOCRIT: 47.6 % — AB (ref 36.0–46.0)
HEMOGLOBIN: 16.2 g/dL — AB (ref 12.0–15.0)
MCH: 30.1 pg (ref 26.0–34.0)
MCHC: 34 g/dL (ref 30.0–36.0)
MCV: 88.5 fL (ref 78.0–100.0)
Platelets: 258 10*3/uL (ref 150–400)
RBC: 5.38 MIL/uL — ABNORMAL HIGH (ref 3.87–5.11)
RDW: 13.3 % (ref 11.5–15.5)
WBC: 16.5 10*3/uL — AB (ref 4.0–10.5)

## 2017-01-28 LAB — CBG MONITORING, ED: GLUCOSE-CAPILLARY: 140 mg/dL — AB (ref 65–99)

## 2017-01-28 LAB — LIPASE, BLOOD: Lipase: 31 U/L (ref 11–51)

## 2017-01-28 LAB — I-STAT CG4 LACTIC ACID, ED
LACTIC ACID, VENOUS: 2.44 mmol/L — AB (ref 0.5–1.9)
Lactic Acid, Venous: 2.94 mmol/L (ref 0.5–1.9)

## 2017-01-28 LAB — HEMOGLOBIN A1C
HEMOGLOBIN A1C: 6.5 % — AB (ref 4.8–5.6)
MEAN PLASMA GLUCOSE: 139.85 mg/dL

## 2017-01-28 LAB — LACTIC ACID, PLASMA
LACTIC ACID, VENOUS: 1.4 mmol/L (ref 0.5–1.9)
Lactic Acid, Venous: 1.6 mmol/L (ref 0.5–1.9)

## 2017-01-28 MED ORDER — ONDANSETRON 4 MG PO TBDP
4.0000 mg | ORAL_TABLET | Freq: Once | ORAL | Status: AC
  Administered 2017-01-28: 4 mg via ORAL

## 2017-01-28 MED ORDER — ONDANSETRON HCL 4 MG/2ML IJ SOLN
4.0000 mg | Freq: Four times a day (QID) | INTRAMUSCULAR | Status: DC | PRN
  Administered 2017-01-29 – 2017-01-31 (×2): 4 mg via INTRAVENOUS
  Filled 2017-01-28 (×2): qty 2

## 2017-01-28 MED ORDER — ONDANSETRON 4 MG PO TBDP
ORAL_TABLET | ORAL | Status: AC
  Filled 2017-01-28: qty 1

## 2017-01-28 MED ORDER — MORPHINE SULFATE (PF) 4 MG/ML IV SOLN
4.0000 mg | Freq: Once | INTRAVENOUS | Status: AC
  Administered 2017-01-28: 4 mg via INTRAVENOUS
  Filled 2017-01-28: qty 1

## 2017-01-28 MED ORDER — ACETAMINOPHEN 325 MG PO TABS
650.0000 mg | ORAL_TABLET | Freq: Four times a day (QID) | ORAL | Status: DC | PRN
  Administered 2017-01-31 (×2): 650 mg via ORAL
  Filled 2017-01-28 (×2): qty 2

## 2017-01-28 MED ORDER — INSULIN ASPART 100 UNIT/ML ~~LOC~~ SOLN
0.0000 [IU] | SUBCUTANEOUS | Status: DC
  Administered 2017-01-28: 1 [IU] via SUBCUTANEOUS
  Administered 2017-01-28: 2 [IU] via SUBCUTANEOUS
  Administered 2017-01-29 (×4): 1 [IU] via SUBCUTANEOUS
  Administered 2017-01-30 (×3): 2 [IU] via SUBCUTANEOUS
  Administered 2017-01-30: 1 [IU] via SUBCUTANEOUS
  Administered 2017-01-31: 2 [IU] via SUBCUTANEOUS
  Administered 2017-01-31: 3 [IU] via SUBCUTANEOUS
  Administered 2017-01-31 – 2017-02-01 (×4): 1 [IU] via SUBCUTANEOUS
  Administered 2017-02-01: 2 [IU] via SUBCUTANEOUS
  Filled 2017-01-28: qty 1

## 2017-01-28 MED ORDER — ENOXAPARIN SODIUM 40 MG/0.4ML ~~LOC~~ SOLN
40.0000 mg | SUBCUTANEOUS | Status: DC
  Administered 2017-01-28 – 2017-01-31 (×3): 40 mg via SUBCUTANEOUS
  Filled 2017-01-28 (×4): qty 0.4

## 2017-01-28 MED ORDER — PIPERACILLIN-TAZOBACTAM 3.375 G IVPB
3.3750 g | Freq: Three times a day (TID) | INTRAVENOUS | Status: DC
  Administered 2017-01-28 – 2017-02-01 (×11): 3.375 g via INTRAVENOUS
  Filled 2017-01-28 (×13): qty 50

## 2017-01-28 MED ORDER — ONDANSETRON HCL 4 MG/2ML IJ SOLN
4.0000 mg | Freq: Once | INTRAMUSCULAR | Status: AC
  Administered 2017-01-28: 4 mg via INTRAVENOUS
  Filled 2017-01-28: qty 2

## 2017-01-28 MED ORDER — SODIUM CHLORIDE 0.9 % IV BOLUS (SEPSIS)
1000.0000 mL | Freq: Once | INTRAVENOUS | Status: AC
  Administered 2017-01-28: 1000 mL via INTRAVENOUS

## 2017-01-28 MED ORDER — ACETAMINOPHEN 650 MG RE SUPP: 650.0000 mg | Freq: Four times a day (QID) | RECTAL | Status: DC | PRN

## 2017-01-28 MED ORDER — SODIUM CHLORIDE 0.9 % IV SOLN
INTRAVENOUS | Status: DC
  Administered 2017-01-29 – 2017-01-31 (×4): via INTRAVENOUS

## 2017-01-28 MED ORDER — PROMETHAZINE HCL 25 MG/ML IJ SOLN
25.0000 mg | Freq: Four times a day (QID) | INTRAMUSCULAR | Status: DC | PRN
  Administered 2017-01-29: 25 mg via INTRAVENOUS
  Filled 2017-01-28: qty 1

## 2017-01-28 MED ORDER — MORPHINE SULFATE (PF) 4 MG/ML IV SOLN
1.0000 mg | INTRAVENOUS | Status: DC | PRN
  Administered 2017-01-28 – 2017-01-31 (×2): 4 mg via INTRAVENOUS
  Filled 2017-01-28 (×2): qty 1

## 2017-01-28 MED ORDER — PIPERACILLIN-TAZOBACTAM 3.375 G IVPB 30 MIN
3.3750 g | Freq: Once | INTRAVENOUS | Status: AC
  Administered 2017-01-28: 3.375 g via INTRAVENOUS
  Filled 2017-01-28: qty 50

## 2017-01-28 MED ORDER — SODIUM CHLORIDE 0.9% FLUSH
3.0000 mL | Freq: Two times a day (BID) | INTRAVENOUS | Status: DC
  Administered 2017-01-31: 3 mL via INTRAVENOUS

## 2017-01-28 NOTE — ED Notes (Signed)
CRITICAL VALUE ALERT  Critical Value:  Lactic Acid 2.94  Date & Time Notied:  01/28/2017 1206   Provider Notified: Dr. Clarene DukeLittle  Orders Received/Actions taken: Will move pt to room

## 2017-01-28 NOTE — ED Provider Notes (Signed)
[facility] Note   CSN: [id number] Arrival date & time: 01/28/17  1054     History   Chief Complaint Chief Complaint  Patient presents with  . Abdominal Pain  . Nausea    HPI Eliezer MccoyDebra Proia is a 61 y.o. female with hx of DM, hypertension who presents today with chief complaint acute onset, progressively worsening right upper quadrant abdominal pain since yesterday.  Patient states pain initially began around 11 AM yesterday but improved after taking a Gas-X.  Patient took a nap and then awoke with no pain.  Patient endorses decreased appetite and noted recurrence of pain after eating chicken noodle soup.  Pain is now constant, radiates to the back, worsens with breathing and movement.  Pain is sharp and stabbing.  She endorses one episode of nonbloody watery stools and nausea but no vomiting.  Endorses subjective fevers and chills.  Denies urinary symptoms, vaginal symptoms, melena, hematochezia, chest pain, or shortness of breath.  She went to urgent care earlier today for evaluation and they recommended presentation to the ED for further workup.   Of note, patient denies recent travel, excessive Tylenol use, IV drug use or exposure to needles, or alcohol use.   The history is provided by the patient.    Past Medical History:  Diagnosis Date  . Diabetes mellitus   . Hypertension   . Seasonal allergies     Patient Active Problem List   Diagnosis Date Noted  . RUQ abdominal pain 01/28/2017  . Choledocholithiasis 01/28/2017  . Diabetes mellitus 01/28/2017  . Hypertension 01/28/2017  . SIRS (systemic inflammatory response syndrome) (HCC) 01/28/2017  . Abnormal urinalysis 01/28/2017  . Hepatic steatosis 01/28/2017  . Diabetes mellitus, type 2 (HCC) 05/06/2011    Past Surgical History:  Procedure Laterality Date  . ABDOMINAL HYSTERECTOMY    . KNEE ARTHROSCOPY    . NOSE SURGERY    . TUBAL LIGATION      OB History    No data  available       Home Medications    Prior to Admission medications   Medication Sig Start Date End Date Taking? Authorizing Provider  lisinopril (PRINIVIL,ZESTRIL) 5 MG tablet Take 1 tablet (5 mg total) by mouth daily. 01/07/16  Yes Copland, Gwenlyn FoundJessica C, MD  metFORMIN (GLUCOPHAGE) 1000 MG tablet TAKE 1 TABLET BY MOUTH 2 TIMES DAILY WITH A MEAL. Patient taking differently: TAKE 500 mg TABLET BY MOUTH 2 TIMES DAILY WITH A MEAL. 03/30/15  Yes Emi BelfastGessner, Deborah B, FNP  amLODipine (NORVASC) 5 MG tablet TAKE 1 TABLET (5 MG TOTAL) BY MOUTH DAILY. Patient not taking: Reported on 01/28/2017 10/15/14   Emi BelfastGessner, Deborah B, FNP  amLODipine (NORVASC) 5 MG tablet TAKE 1 TABLET (5 MG TOTAL) BY MOUTH DAILY. Patient not taking: Reported on 01/28/2017 04/25/15   Emi BelfastGessner, Deborah B, FNP  Cholecalciferol 50000 UNITS capsule Take 1 capsule by mouth every week for 8 weeks, then 1 capsule every 2 weeks. Patient not taking: Reported on 07/11/2015 05/15/14   Emi BelfastGessner, Deborah B, FNP    Family History Family History  Problem Relation Age of Onset  . Hypertension Father     Social History Social History   Tobacco Use  . Smoking status: Former Smoker  Substance Use Topics  . Alcohol use: No    Alcohol/week: 0.0 oz  . Drug use: No     Allergies   Erythromycin and Tetracyclines & related   Review of Systems Review of Systems  Constitutional: Positive for chills and fever.  Respiratory: Negative for shortness of breath.   Cardiovascular: Negative for chest pain.  Gastrointestinal: Positive for abdominal pain, diarrhea and nausea. Negative for blood in stool and vomiting.  Genitourinary: Negative for dysuria, frequency, hematuria, urgency, vaginal bleeding, vaginal discharge and vaginal pain.  All other systems reviewed and are negative.    Physical Exam Updated Vital Signs BP 107/66   Pulse 80   Temp 99.3 F (37.4 C)   Resp 15   SpO2 96%   Physical Exam  Constitutional: She appears  well-developed and well-nourished. No distress.  Appears uncomfortable  HENT:  Head: Normocephalic and atraumatic.  Eyes: Conjunctivae are normal. Right eye exhibits no discharge. Left eye exhibits no discharge.  Neck: No JVD present. No tracheal deviation present.  Cardiovascular: Normal rate, regular rhythm and normal heart sounds.  Pulmonary/Chest: Effort normal and breath sounds normal.  Hesitant to take a deep breath secondary to abdominal pain  Abdominal: Soft. Bowel sounds are normal. She exhibits no distension. There is no hepatosplenomegaly, splenomegaly or hepatomegaly. There is tenderness in the right upper quadrant. There is guarding, CVA tenderness and positive Murphy's sign. There is no rigidity, no rebound and no tenderness at McBurney's point.  Obese, right CVA tenderness noted.  Musculoskeletal: She exhibits no edema.  Neurological: She is alert.  Skin: Skin is warm and dry. No erythema.  Psychiatric: She has a normal mood and affect. Her behavior is normal.  Nursing note and vitals reviewed.    ED Treatments / Results  Labs (all labs ordered are listed, but only abnormal results are displayed) Labs Reviewed  COMPREHENSIVE METABOLIC PANEL - Abnormal; Notable for the following components:      Result Value   Glucose, Bld 139 (*)    AST 195 (*)    ALT 206 (*)    Total Bilirubin 6.7 (*)    All other components within normal limits  CBC - Abnormal; Notable for the following components:   WBC 16.5 (*)    RBC 5.38 (*)    Hemoglobin 16.2 (*)    HCT 47.6 (*)    All other components within normal limits  URINALYSIS, ROUTINE W REFLEX MICROSCOPIC - Abnormal; Notable for the following components:   Color, Urine AMBER (*)    APPearance HAZY (*)    Hgb urine dipstick SMALL (*)    Bilirubin Urine MODERATE (*)    Bacteria, UA RARE (*)    Squamous Epithelial / LPF 0-5 (*)    All other components within normal limits  I-STAT CG4 LACTIC ACID, ED - Abnormal; Notable for the  following components:   Lactic Acid, Venous 2.94 (*)    All other components within normal limits  I-STAT CG4 LACTIC ACID, ED - Abnormal; Notable for the following components:   Lactic Acid, Venous 2.44 (*)    All other components within normal limits  CBG MONITORING, ED - Abnormal; Notable for the following components:   Glucose-Capillary 140 (*)    All other components within normal limits  CULTURE, BLOOD (ROUTINE X 2)  CULTURE, BLOOD (ROUTINE X 2)  URINE CULTURE  LIPASE, BLOOD  HEPATITIS PANEL, ACUTE  HEMOGLOBIN A1C  LACTIC ACID, PLASMA  LACTIC ACID, PLASMA  HIV ANTIBODY (ROUTINE TESTING)    EKG  EKG Interpretation None       Radiology [place] Abdomen Limited  Result Date: 01/28/2017 CLINICAL DATA:  Right upper quadrant pain for 2 days. EXAM: ULTRASOUND ABDOMEN LIMITED RIGHT UPPER QUADRANT COMPARISON:  08/21/2014 FINDINGS: Gallbladder: Not well visualized due to bowel gas and body habitus. No stones are evident. Gallbladder wall measures upper normal for thickness. The sonographer reports no sonographic Murphy sign. Common bile duct: Diameter: 5 mm Liver: Diffusely increased echogenicity suggests steatosis. Portal vein is patent on color Doppler imaging with normal direction of blood flow towards the liver. IMPRESSION: No definite sonographic findings to suggest cholelithiasis. Electronically Signed   By: Kennith Center M.D.   On: 01/28/2017 13:21    Procedures Procedures (including critical care time)  Medications Ordered in ED Medications  0.9 %  sodium chloride infusion (not administered)  insulin aspart (novoLOG) injection 0-9 Units (1 Units Subcutaneous Given 01/28/17 1625)  morphine 4 MG/ML injection 1-4 mg (not administered)  ondansetron (ZOFRAN) injection 4 mg (not administered)    Or  promethazine (PHENERGAN) injection 25 mg (not administered)  enoxaparin (LOVENOX) injection 40 mg (not administered)  sodium chloride flush (NS) 0.9 % injection 3 mL (not  administered)  acetaminophen (TYLENOL) tablet 650 mg (not administered)    Or  acetaminophen (TYLENOL) suppository 650 mg (not administered)  piperacillin-tazobactam (ZOSYN) IVPB 3.375 g (not administered)  sodium chloride 0.9 % bolus 1,000 mL (1,000 mLs Intravenous New Bag/Given 01/28/17 1423)  piperacillin-tazobactam (ZOSYN) IVPB 3.375 g (0 g Intravenous Stopped 01/28/17 1452)  morphine 4 MG/ML injection 4 mg (4 mg Intravenous Given 01/28/17 1422)  ondansetron (ZOFRAN) injection 4 mg (4 mg Intravenous Given 01/28/17 1422)     Initial Impression / Assessment and Plan / ED Course  I have reviewed the triage vital signs and the nursing notes.  Pertinent labs & imaging results that were available during my care of the patient were reviewed by me and considered in my medical decision making (see chart for details).    Patient with right upper quadrant abdominal pain, nausea, and diarrhea since yesterday.  Low-grade fever 99.3 F, slightly hypotensive initially.  Initial lactate elevated at 2.94, with a leukocytosis of 16.5.  CMP remarkable for elevated AST, ALT, and T bili.  Ultrasound of the abdomen limited by body habitus, however no definite sonographic evidence of cholelithiasis or cholecystitis. Question possible acute hepatitis, hepatitis panel ordered. With leukocytosis and elevated lactate, will initiate IV antibiotics with Zosyn.  Consulted GI, spoke with your gastroenterology who will see and evaluate the patient in the hospital. Spoke with Junious Silk, who agrees to assume care of patient and bring her into the hospital for further evaluation under the care of Dr. Mariea Clonts. Patient seen and evaluated by Dr. Clarene Duke who agrees with assessment and plan at this time.  Final Clinical Impressions(s) / ED Diagnoses   Final diagnoses:  Right upper quadrant abdominal pain  Elevated LFTs  Lactic acid increased    ED Discharge Orders    None       Jeanie Sewer, PA-C 01/28/17 1637     Little, Ambrose Finland, MD 01/31/17 2131

## 2017-01-28 NOTE — ED Notes (Signed)
Pt ambulatory to bathroom with steady gait at this time .

## 2017-01-28 NOTE — ED Notes (Signed)
Pt is difficult stick, IV team consulted

## 2017-01-28 NOTE — Progress Notes (Signed)
Pharmacy Antibiotic Note  Olivia MccoyDebra Middleton is a 61 y.o. female admitted on 01/28/2017 with Choledocholithiasis .  Pharmacy has been consulted for Zosyn dosing. Afebrile, Scr 0.98, WBC 16.5, LA 2.94  Plan: Zosyn 3.375G IV X1 30 min infusion THEN 3.375G IV Q8H; 4 hour infusion   F/U renal function, clinical status, C&S, LOT  Temp (24hrs), Avg:98.8 F (37.1 C), Min:98.4 F (36.9 C), Max:99.3 F (37.4 C)  Recent Labs  Lab 01/28/17 1113 01/28/17 1152 01/28/17 1419  WBC 16.5*  --   --   CREATININE 0.98  --   --   LATICACIDVEN  --  2.94* 2.44*    CrCl cannot be calculated (Unknown ideal weight.).    Allergies  Allergen Reactions  . Erythromycin   . Tetracyclines & Related     Antimicrobials this admission: Zosyn 1129>>  Dose adjustments this admission: Microbiology results:  Thank you for allowing pharmacy to be a part of this patient's care.  Karna DupesMedina Rodgers Likes  PharmD Candidate '19 01/04/2017 3:30 PM

## 2017-01-28 NOTE — ED Provider Notes (Signed)
[place]    CSN: [id number] Arrival date & time: 01/28/17  1000     History   Chief Complaint Chief Complaint  Patient presents with  . Nausea  . Abdominal Pain    HPI Olivia Middleton is a 61 y.o. female.   61 year-old female, presenting today complaining of RUQ abdominal pain. Pain started last night after eating. States that she took a gas-X that seemed to relieve the pain. However, she continued to feel nauseated. States that after eating breakfast this morning, the pain returned. She is now complaining of pain with breathing as well as movement. Continues to complain of nausea and subjective fever and chills. She has also had several episodes of diarrhea.    The history is provided by the patient.  Abdominal Pain  Pain location:  RUQ Pain quality: aching   Pain radiates to:  Does not radiate Pain severity:  Moderate Onset quality:  Gradual Duration:  2 days Timing:  Constant Progression:  Worsening Chronicity:  New Context: eating   Context: not alcohol use, not awakening from sleep, not diet changes and not laxative use   Relieved by:  Nothing Worsened by:  Movement and deep breathing Ineffective treatments:  None tried Associated symptoms: chills, diarrhea and nausea   Associated symptoms: no chest pain, no cough, no dysuria, no fever, no hematuria, no shortness of breath, no sore throat and no vomiting   Risk factors: obesity   Risk factors: no alcohol abuse, no aspirin use, not elderly, has not had multiple surgeries and no NSAID use     Past Medical History:  Diagnosis Date  . Diabetes mellitus   . Hypertension   . Seasonal allergies     Patient Active Problem List   Diagnosis Date Noted  . Diabetes mellitus, type 2 ([place]) 05/06/2011    Past Surgical History:  Procedure Laterality Date  . ABDOMINAL HYSTERECTOMY    . KNEE ARTHROSCOPY    . NOSE SURGERY    . TUBAL LIGATION      OB History    No data available       Home  Medications    Prior to Admission medications   Medication Sig Start Date End Date Taking? Authorizing Provider  amLODipine (NORVASC) 5 MG tablet TAKE 1 TABLET (5 MG TOTAL) BY MOUTH DAILY. 10/15/14   Elby Beck, FNP  amLODipine (NORVASC) 5 MG tablet TAKE 1 TABLET (5 MG TOTAL) BY MOUTH DAILY. 04/25/15   Elby Beck, FNP  blood glucose meter kit and supplies KIT Dispense based on patient and insurance preference. Use up to four times daily as directed. (FOR ICD-9 250.00, 250.01). 04/18/14   Elby Beck, FNP  Blood Pressure Monitoring DEVI 1 Units by Does not apply route once. 04/16/14   Elby Beck, FNP  Cholecalciferol 50000 UNITS capsule Take 1 capsule by mouth every week for 8 weeks, then 1 capsule every 2 weeks. Patient not taking: Reported on 07/11/2015 05/15/14   Elby Beck, FNP  glucose blood (ONE TOUCH ULTRA TEST) test strip Use as instructed 04/25/14   Elby Beck, FNP  lisinopril (PRINIVIL,ZESTRIL) 5 MG tablet Take 1 tablet (5 mg total) by mouth daily. 01/07/16   Copland, Gay Filler, MD  metFORMIN (GLUCOPHAGE) 1000 MG tablet TAKE 1 TABLET BY MOUTH 2 TIMES DAILY WITH A MEAL. 03/30/15   Elby Beck, FNP  [facility] DELICA LANCETS 26J MISC 1 Units by Does not apply route 2 (two) times daily. 04/25/14  Elby Beck, FNP    Family History Family History  Problem Relation Age of Onset  . Hypertension Father     Social History Social History   Tobacco Use  . Smoking status: Former Smoker  Substance Use Topics  . Alcohol use: No    Alcohol/week: 0.0 oz  . Drug use: No     Allergies   Erythromycin and Tetracyclines & related   Review of Systems Review of Systems  Constitutional: Positive for chills. Negative for fever.  HENT: Negative for ear pain and sore throat.   Eyes: Negative for pain and visual disturbance.  Respiratory: Negative for cough and shortness of breath.   Cardiovascular: Negative for chest pain and palpitations.    Gastrointestinal: Positive for abdominal pain, diarrhea and nausea. Negative for vomiting.  Genitourinary: Negative for dysuria and hematuria.  Musculoskeletal: Negative for arthralgias and back pain.  Skin: Negative for color change and rash.  Neurological: Negative for seizures and syncope.  All other systems reviewed and are negative.    Physical Exam Triage Vital Signs ED Triage Vitals  Enc Vitals Group     BP --      Pulse Rate 01/28/17 1030 85     Resp 01/28/17 1030 18     Temp 01/28/17 1030 98.6 F (37 C)     Temp Source 01/28/17 1030 Oral     SpO2 01/28/17 1030 98 %     Weight --      Height --      Head Circumference --      Peak Flow --      Pain Score 01/28/17 1029 7     Pain Loc --      Pain Edu? --      Excl. in [place]? --    No data found.  Updated Vital Signs Pulse 85   Temp 98.6 F (37 C) (Oral)   Resp 18   SpO2 98%   Visual Acuity Right Eye Distance:   Left Eye Distance:   Bilateral Distance:    Right Eye Near:   Left Eye Near:    Bilateral Near:     Physical Exam  Constitutional: She appears well-developed and well-nourished. No distress.  HENT:  Head: Normocephalic and atraumatic.  Eyes: Conjunctivae are normal.  Neck: Neck supple.  Cardiovascular: Normal rate and regular rhythm.  No murmur heard. Pulmonary/Chest: Effort normal and breath sounds normal. No respiratory distress.  Abdominal: Soft. There is tenderness in the right upper quadrant. There is positive Murphy's sign.  Musculoskeletal: She exhibits no edema.  Neurological: She is alert.  Skin: Skin is warm and dry.  Psychiatric: She has a normal mood and affect.  Nursing note and vitals reviewed.    UC Treatments / Results  Labs (all labs ordered are listed, but only abnormal results are displayed) Labs Reviewed - No data to display  EKG  EKG Interpretation None       Radiology No results found.  Procedures Procedures (including critical care  time)  Medications Ordered in UC Medications  ondansetron (ZOFRAN-ODT) disintegrating tablet 4 mg (4 mg Oral Given 01/28/17 1034)     Initial Impression / Assessment and Plan / UC Course  I have reviewed the triage vital signs and the nursing notes.  Pertinent labs & imaging results that were available during my care of the patient were reviewed by me and considered in my medical decision making (see chart for details).     RUQ pain, worsened by  eating with nausea and diarrhea Tenderness on exam with + Murphy's sign Will send to the ED to rule out cholecystitis   Final Clinical Impressions(s) / UC Diagnoses   Final diagnoses:  RUQ pain  Positive Murphy's Sign    ED Discharge Orders    None       Controlled Substance Prescriptions [place] Controlled Substance Registry consulted? Not Applicable   Phebe Colla, [place] 01/28/17 1054

## 2017-01-28 NOTE — ED Triage Notes (Signed)
Patient complains of 2 days of RUQ pain with nausea and diarrhea that started today. Complains of feeling full, pale on arrival, alert and oriented. [facility]

## 2017-01-28 NOTE — H&P (Signed)
History and Physical    Olivia Middleton PPI:[id number] DOB: Nov 20, 1955 DOA: 01/28/2017   PCP: Kelton Pillar, MD   Attending physician: Denton Brick  Patient coming from/Resides with: Private residence/husband  Chief Complaint: Right upper quadrant abdominal pain with nausea and vomiting  HPI: Olivia Middleton is a 61 y.o. female with medical history significant for hepatic steatosis, diabetes on metformin, hypertension, and obesity.  Patient has mild chronic loose stools daily secondary to metformin use.  Patient reported that yesterday she had mild right upper quadrant abdominal pain that she felt was related to gas.  She took a Gas-X pill and felt much better after laying down and resting.  She was quite nauseated at the same time.  This morning she was having no symptoms.  She ate breakfast that consisted of eggs and bacon.  No symptoms immediately following eating.  She went to work as usual and after being at work about an hour she developed severe right upper quadrant abdominal pain that worsened with deep breath.  She was quite nauseous.  She went to have a bowel movement and began having diarrhea that was not typical for her baseline.  Because of the severity of symptoms she presented to the ER.  She was afebrile and normotensive.  She was not hypoxemic.  Alkaline phosphatase was normal but AST elevated @ 195 AST elevated @ 206, total bilirubin elevated at 6.7.  White count 16,500 differential not obtained, lactic acid elevated at 2.94 and 2.44.  Hemoglobin elevated at 16.2 her baseline appears to be 15, urinalysis was abnormal with moderate bilirubin, amber color, small hemoglobin, rare bacteria, 6-30 WBCs.  Abdominal ultrasound did not reveal any cholelithiasis or acute cholecystitis.  EDP reports that have contacted GI who will see the patient.  Patient's pain has improved after administration of IV narcotics.  She has also been given a fluid challenge and a dose of IV Zosyn.  ED Course:    Vital Signs: BP 115/62   Pulse 87   Temp 99.3 F (37.4 C)   Resp 16   SpO2 94%  Abdominal ultrasound: Not well visualized due to bowel gas and body habitus.  No obvious stones, no gallbladder wall thickness and no sonographic Murphy sign.  Common bile duct measures 5 mm Lab data: As above; hepatitis panel has been obtained but has not resulted Medications and treatments: Normal saline bolus times 1 L, Zosyn 3.375 g IV x1, morphine 4 mg IV x1, Zofran 4 mg IV x1  Review of Systems:  In addition to the HPI above,  No Fever-chills, myalgias or other constitutional symptoms No Headache, changes with Vision or hearing, new weakness, tingling, numbness in any extremity, dizziness, dysarthria or word finding difficulty, gait disturbance or imbalance, tremors or seizure activity No problems swallowing food or Liquids, indigestion/reflux, choking or coughing while eating, abdominal pain with or after eating No Chest pain, Cough or Shortness of Breath, palpitations, orthopnea or DOE No melena,hematochezia, dark tarry stools, constipation No dysuria, malodorous urine, hematuria or flank pain No new skin rashes, lesions, masses or bruises, No new joint pains, aches, swelling or redness No recent unintentional weight gain or loss No polyuria, polydypsia or polyphagia   Past Medical History:  Diagnosis Date  . Diabetes mellitus   . Hypertension   . Seasonal allergies     Past Surgical History:  Procedure Laterality Date  . ABDOMINAL HYSTERECTOMY    . KNEE ARTHROSCOPY    . NOSE SURGERY    . TUBAL LIGATION  Social History   Socioeconomic History  . Marital status: Single    Spouse name: Not on file  . Number of children: Not on file  . Years of education: Not on file  . Highest education level: Not on file  Social Needs  . Financial resource strain: Not on file  . Food insecurity - worry: Not on file  . Food insecurity - inability: Not on file  . Transportation needs -  medical: Not on file  . Transportation needs - non-medical: Not on file  Occupational History  . Occupation: office support    Employer: [employer]  Tobacco Use  . Smoking status: Former Smoker  Substance and Sexual Activity  . Alcohol use: No    Alcohol/week: 0.0 oz  . Drug use: No  . Sexual activity: Not on file  Other Topics Concern  . Not on file  Social History Narrative  . Not on file    Mobility: Independent Work history: Employed outside the home   Allergies  Allergen Reactions  . Erythromycin   . Tetracyclines & Related     Family History  Problem Relation Age of Onset  . Hypertension Father      Prior to Admission medications   Medication Sig Start Date End Date Taking? Authorizing Provider  amLODipine (NORVASC) 5 MG tablet TAKE 1 TABLET (5 MG TOTAL) BY MOUTH DAILY. 10/15/14   Elby Beck, FNP  amLODipine (NORVASC) 5 MG tablet TAKE 1 TABLET (5 MG TOTAL) BY MOUTH DAILY. 04/25/15   Elby Beck, FNP  blood glucose meter kit and supplies KIT Dispense based on patient and insurance preference. Use up to four times daily as directed. (FOR ICD-9 250.00, 250.01). 04/18/14   Elby Beck, FNP  Blood Pressure Monitoring DEVI 1 Units by Does not apply route once. 04/16/14   Elby Beck, FNP  Cholecalciferol 50000 UNITS capsule Take 1 capsule by mouth every week for 8 weeks, then 1 capsule every 2 weeks. Patient not taking: Reported on 07/11/2015 05/15/14   Elby Beck, FNP  glucose blood (ONE TOUCH ULTRA TEST) test strip Use as instructed 04/25/14   Elby Beck, FNP  lisinopril (PRINIVIL,ZESTRIL) 5 MG tablet Take 1 tablet (5 mg total) by mouth daily. 01/07/16   Copland, Gay Filler, MD  metFORMIN (GLUCOPHAGE) 1000 MG tablet TAKE 1 TABLET BY MOUTH 2 TIMES DAILY WITH A MEAL. 03/30/15   Elby Beck, FNP  [facility] DELICA LANCETS 69G MISC 1 Units by Does not apply route 2 (two) times daily. 04/25/14   Elby Beck, FNP     Physical Exam: Vitals:   01/28/17 1330 01/28/17 1345 01/28/17 1445 01/28/17 1500  BP: 121/66 104/80 (!) 104/55 115/62  Pulse: 76 90 78 87  Resp: _0 Temp:      TempSrc:      SpO2: 97% 96% 92% 94%      Constitutional: NAD, calm, uncomfortable secondary to ongoing abdominal pain which now is only evident with palpation Eyes: PERRL, lids and conjunctivae normal ENMT: Mucous membranes are moist. Posterior pharynx clear of any exudate or lesions.Normal dentition.  Neck: normal, supple, no masses, no thyromegaly Respiratory: clear to auscultation bilaterally, no wheezing, no crackles. Normal respiratory effort. No accessory muscle use.  Cardiovascular: Regular rate and rhythm, no murmurs / rubs / gallops. No extremity edema. 2+ pedal pulses. No carotid bruits.  Abdomen: Focal right upper quadrant tenderness w/ palpation, no masses palpated. No hepatosplenomegaly. Bowel sounds  positive.  Musculoskeletal: no clubbing / cyanosis. No joint deformity upper and lower extremities. Good ROM, no contractures. Normal muscle tone.  Skin: no rashes, lesions, ulcers. No induration Neurologic: CN 2-12 grossly intact. Sensation intact, DTR normal. Strength 5/5 x all 4 extremities.  Psychiatric: Normal judgment and insight. Alert and oriented x 3. Normal mood.    Labs on Admission: I have personally reviewed following labs and imaging studies  CBC: Recent Labs  Lab 01/28/17 1113  WBC 16.5*  HGB 16.2*  HCT 47.6*  MCV 88.5  PLT 027   Basic Metabolic Panel: Recent Labs  Lab 01/28/17 1113  NA 138  K 3.8  CL 104  CO2 24  GLUCOSE 139*  BUN 13  CREATININE 0.98  CALCIUM 9.2   GFR: CrCl cannot be calculated (Unknown ideal weight.). Liver Function Tests: Recent Labs  Lab 01/28/17 1113  AST 195*  ALT 206*  ALKPHOS 118  BILITOT 6.7*  PROT 7.5  ALBUMIN 3.5   Recent Labs  Lab 01/28/17 1113  LIPASE 31   No results for input(s): AMMONIA in the last 168  hours. Coagulation Profile: No results for input(s): INR, PROTIME in the last 168 hours. Cardiac Enzymes: No results for input(s): CKTOTAL, CKMB, CKMBINDEX, TROPONINI in the last 168 hours. BNP (last 3 results) No results for input(s): PROBNP in the last 8760 hours. HbA1C: No results for input(s): HGBA1C in the last 72 hours. CBG: No results for input(s): GLUCAP in the last 168 hours. Lipid Profile: No results for input(s): CHOL, HDL, LDLCALC, TRIG, CHOLHDL, LDLDIRECT in the last 72 hours. Thyroid Function Tests: No results for input(s): TSH, T4TOTAL, FREET4, T3FREE, THYROIDAB in the last 72 hours. Anemia Panel: No results for input(s): VITAMINB12, FOLATE, FERRITIN, TIBC, IRON, RETICCTPCT in the last 72 hours. Urine analysis:    Component Value Date/Time   COLORURINE AMBER (A) 01/28/2017 1315   APPEARANCEUR HAZY (A) 01/28/2017 1315   LABSPEC 1.016 01/28/2017 1315   PHURINE 5.0 01/28/2017 1315   GLUCOSEU NEGATIVE 01/28/2017 1315   HGBUR SMALL (A) 01/28/2017 1315   BILIRUBINUR MODERATE (A) 01/28/2017 1315   BILIRUBINUR negative 07/11/2015 0844   KETONESUR NEGATIVE 01/28/2017 1315   PROTEINUR NEGATIVE 01/28/2017 1315   UROBILINOGEN 0.2 07/11/2015 0844   UROBILINOGEN 1.0 02/14/2014 1210   NITRITE NEGATIVE 01/28/2017 1315   LEUKOCYTESUR NEGATIVE 01/28/2017 1315   Sepsis Labs: _0 (procalcitonin:4,lacticidven:4) )No results found for this or any previous visit (from the past 240 hour(s)).   Radiological Exams on Admission: US Abdomen Limited  Result Date: 01/28/2017 CLINICAL DATA:  Right upper quadrant pain for 2 days. EXAM: ULTRASOUND ABDOMEN LIMITED RIGHT UPPER QUADRANT COMPARISON:  08/21/2014 FINDINGS: Gallbladder: Not well visualized due to bowel gas and body habitus. No stones are evident. Gallbladder wall measures upper normal for thickness. The sonographer reports no sonographic Murphy sign. Common bile duct: Diameter: 5 mm Liver: Diffusely increased echogenicity  suggests steatosis. Portal vein is patent on color Doppler imaging with normal direction of blood flow towards the liver. IMPRESSION: No definite sonographic findings to suggest cholelithiasis. Electronically Signed   By: Misty Stanley M.D.   On: 01/28/2017 13:21     Assessment/Plan Principal Problem:   RUQ abdominal pain /?Choledocholithiasis -Patient presents with abrupt onset of abdominal pain x 2- worst episode today after eating fatty meal with elevation in total bilirubin and ALT/AST concerning for choledocholithiasis -Patient's body habitus and bowel gas interfering with adequate abdominal ultrasound and therefore undetected cholelithiasis may be present -GI consultation pending -Obtain MRCP to  better clarify -Clear liquids  -IV morphine for pain -IV Zofran/Phenergan for nausea -IV Zosyn for SIRS physiology/possible early ascending cholangitis -Obtain blood cultures -Follow-up on hepatitis panel -We will need formal surgical consultation regarding staging/timing of cholecystectomy  Active Problems:   SIRS (systemic inflammatory response syndrome)  -Patient presents with acute abdominal pain with significant leukocytosis and elevated lactic acid with source of infection suspected to be gallbladder -Continue to cycle lactic acid -Fluid resuscitation -Empiric antibiotics to cover gram-negative organisms -Follow urine output and vital signs closely-currently does not meet criteria for sepsis    Diabetes mellitus -Hold home metformin acutely -SSI -HgbA1c    Hypertension -Current blood pressure suboptimal in setting of gram-negative SIRS/?  Evolving sepsis therefore hold home antihypertensive medications    Hepatic steatosis -At baseline transaminases have been normal    Abnormal urinalysis -Obtain urine culture -Empiric antibiotics as above      DVT prophylaxis: Lovenox Code Status: Full Family Communication: Husband Disposition Plan: Home Consults called: GI  consulted by EDP    Olivia Middleton L. ANP-BC [facility] Pager [phone]   If 7PM-7AM, please contact night-coverage [email or web address] Password [email or web address]  01/28/2017, 3:21 PM

## 2017-01-28 NOTE — ED Notes (Signed)
Patient discharged to ED.

## 2017-01-28 NOTE — Consult Note (Signed)
[facility] Consult  Referring Provider: Cameron AliBlue, Olivia C, PA-[place] Primary Care Physician:  Olivia SmallGriffin, Elaine, MD Primary Gastroenterologist: None  Reason for Consultation:  Right upper quadrant pain  HPI: Olivia Middleton is a 61 y.o. female was seen in ER with complaints of right upper quadrant pain. She reports that pain started yesterday evening after a meal. She immediately felt sick and nauseous, later was able to sleep overnight, was okay in the morning, however after finishing her breakfast, right upper quadrant pain returned. She states it is non radiating, it is present mostly in the right upper quadrant and right flank, it is associated with nausea without vomiting. Patient reports similar pain present intermittently for several years. She has noticed that pain is worsened after a meal. Patient is on metformin and reports loose stools, fecal urgency and postprandial diarrhea on a regular basis. She reports having an endoscopy and a colonoscopy 20 years ago, and was told she was lactose intolerant. Patient denies fever/chills or rigors. Denies yellowish discoloration of skin, change in the color of stool or urine. She does not drink alcohol, denies sick contacts, denies use of over-the-counter medication or herbal products. Denies history of jaundice in the past.  Past Medical History:  Diagnosis Date  . Diabetes mellitus   . Hypertension   . Seasonal allergies     Past Surgical History:  Procedure Laterality Date  . ABDOMINAL HYSTERECTOMY    . KNEE ARTHROSCOPY    . NOSE SURGERY    . TUBAL LIGATION      Prior to Admission medications   Medication Sig Start Date End Date Taking? Authorizing Provider  lisinopril (PRINIVIL,ZESTRIL) 5 MG tablet Take 1 tablet (5 mg total) by mouth daily. 01/07/16  Yes Copland, Gwenlyn FoundJessica C, MD  metFORMIN (GLUCOPHAGE) 1000 MG tablet TAKE 1 TABLET BY MOUTH 2 TIMES DAILY WITH A MEAL. Patient taking differently: TAKE 500 mg TABLET BY MOUTH 2 TIMES  DAILY WITH A MEAL. 03/30/15  Yes Emi BelfastGessner, Deborah B, FNP  amLODipine (NORVASC) 5 MG tablet TAKE 1 TABLET (5 MG TOTAL) BY MOUTH DAILY. Patient not taking: Reported on 01/28/2017 10/15/14   Emi BelfastGessner, Deborah B, FNP  amLODipine (NORVASC) 5 MG tablet TAKE 1 TABLET (5 MG TOTAL) BY MOUTH DAILY. Patient not taking: Reported on 01/28/2017 04/25/15   Emi BelfastGessner, Deborah B, FNP  Cholecalciferol 50000 UNITS capsule Take 1 capsule by mouth every week for 8 weeks, then 1 capsule every 2 weeks. Patient not taking: Reported on 07/11/2015 05/15/14   Emi BelfastGessner, Deborah B, FNP    Current Facility-Administered Medications  Medication Dose Route Frequency Provider Last Rate Last Dose  . 0.9 %  sodium chloride infusion   Intravenous Continuous Russella DarEllis, Allison L, NP      . acetaminophen (TYLENOL) tablet 650 mg  650 mg Oral Q6H PRN Russella DarEllis, Allison L, NP       Or  . acetaminophen (TYLENOL) suppository 650 mg  650 mg Rectal Q6H PRN Russella DarEllis, Allison L, NP      . enoxaparin (LOVENOX) injection 40 mg  40 mg Subcutaneous Q24H Russella DarEllis, Allison L, NP      . insulin aspart (novoLOG) injection 0-9 Units  0-9 Units Subcutaneous Q4H Junious SilkEllis, Allison L, NP      . morphine 4 MG/ML injection 1-4 mg  1-4 mg Intravenous Q2H PRN Russella DarEllis, Allison L, NP      . ondansetron (ZOFRAN) injection 4 mg  4 mg Intravenous Q6H PRN Russella DarEllis, Allison L, NP       Or  .  promethazine (PHENERGAN) injection 25 mg  25 mg Intravenous Q6H PRN Junious SilkEllis, Allison L, NP      . piperacillin-tazobactam (ZOSYN) IVPB 3.375 g  3.375 g Intravenous Q8H Rasul, Medina B, Student-PharmD      . sodium chloride flush (NS) 0.9 % injection 3 mL  3 mL Intravenous Q12H Russella DarEllis, Allison L, NP       Current Outpatient Medications  Medication Sig Dispense Refill  . lisinopril (PRINIVIL,ZESTRIL) 5 MG tablet Take 1 tablet (5 mg total) by mouth daily. 30 tablet 5  . metFORMIN (GLUCOPHAGE) 1000 MG tablet TAKE 1 TABLET BY MOUTH 2 TIMES DAILY WITH A MEAL. (Patient taking differently: TAKE 500 mg TABLET BY  MOUTH 2 TIMES DAILY WITH A MEAL.) 60 tablet 5  . amLODipine (NORVASC) 5 MG tablet TAKE 1 TABLET (5 MG TOTAL) BY MOUTH DAILY. (Patient not taking: Reported on 01/28/2017) 90 tablet 1  . amLODipine (NORVASC) 5 MG tablet TAKE 1 TABLET (5 MG TOTAL) BY MOUTH DAILY. (Patient not taking: Reported on 01/28/2017) 90 tablet 1  . Cholecalciferol 50000 UNITS capsule Take 1 capsule by mouth every week for 8 weeks, then 1 capsule every 2 weeks. (Patient not taking: Reported on 07/11/2015) 30 capsule 0    Allergies as of 01/28/2017 - Review Complete 01/28/2017  Allergen Reaction Noted  . Erythromycin  02/11/2015  . Tetracyclines & related  05/06/2011    Family History  Problem Relation Age of Onset  . Hypertension Father     Social History   Socioeconomic History  . Marital status: Single    Spouse name: Not on file  . Number of children: Not on file  . Years of education: Not on file  . Highest education level: Not on file  Social Needs  . Financial resource strain: Not on file  . Food insecurity - worry: Not on file  . Food insecurity - inability: Not on file  . Transportation needs - medical: Not on file  . Transportation needs - non-medical: Not on file  Occupational History  . Occupation: office support    Employer: [employer]  Tobacco Use  . Smoking status: Former Smoker  Substance and Sexual Activity  . Alcohol use: No    Alcohol/week: 0.0 oz  . Drug use: No  . Sexual activity: Not on file  Other Topics Concern  . Not on file  Social History Narrative  . Not on file    Review of Systems: Positive for: GI: Described in detail in HPI.    Gen: Denies any fever, chills, rigors, night sweats, anorexia, fatigue, weakness, malaise, involuntary weight loss, and sleep disorder CV: Denies chest pain, angina, palpitations, syncope, orthopnea, PND, peripheral edema, and claudication. Resp: Denies dyspnea, cough, sputum, wheezing, coughing up blood. GU : Denies urinary  burning, blood in urine, urinary frequency, urinary hesitancy, nocturnal urination, and urinary incontinence. MS: Denies joint pain or swelling.  Denies muscle weakness, cramps, atrophy.  Derm: Rash from poison ivy 3 weeks ago ,Denies oral ulcerations, hives, unhealing ulcers.  Psych: Denies depression, anxiety, memory loss, suicidal ideation, hallucinations,  and confusion. Heme: Denies bruising, bleeding, and enlarged lymph nodes. Neuro:  Denies any headaches, dizziness, paresthesias. Endo:  problems with DM, Denies any thyroid or adrenal function.  Physical Exam: Vital signs in last 24 hours: Temp:  [98.4 F (36.9 [place])-99.3 F (37.4 [place])] 99.3 F (37.4 [place]) (11/29 1230) Pulse Rate:  [71-90] 87 (11/29 1500) Resp:  [16-19] 16 (11/29 1500) BP: (102-130)/(47-80) 115/62 (11/29 1500) SpO2:  [  92 %-100 %] 94 % (11/29 1500)    General:   Alert,  Well-developed, overweight, pleasant and cooperative in NAD Head:  Normocephalic and atraumatic. Eyes:  Sclera clear, no icterus.   Conjunctiva pink. Ears:  Normal auditory acuity. Nose:  No deformity, discharge,  or lesions. Mouth:  No deformity or lesions.  Oropharynx pink & moist. Neck:  Supple; no masses or thyromegaly. Lungs:  Clear throughout to auscultation.   No wheezes, crackles, or rhonchi. No acute distress. Heart:  Regular rate and rhythm; no murmurs, clicks, rubs,  or gallops. Extremities:  Without clubbing or edema. Neurologic:  Alert and  oriented x4;  grossly normal neurologically. Skin:  Intact without significant lesions or rashes. Psych:  Alert and cooperative. Normal mood and affect. Abdomen:  Right upper quadrant tenderness, positive Murphy sign, non distended, active bowel sounds, soft otherwise     Lab Results: Recent Labs    01/28/17 1113  WBC 16.5*  HGB 16.2*  HCT 47.6*  PLT 258   BMET Recent Labs    01/28/17 1113  NA 138  K 3.8  CL 104  CO2 24  GLUCOSE 139*  BUN 13  CREATININE 0.98  CALCIUM 9.2    LFT Recent Labs    01/28/17 1113  PROT 7.5  ALBUMIN 3.5  AST 195*  ALT 206*  ALKPHOS 118  BILITOT 6.7*   PT/INR No results for input(s): LABPROT, INR in the last 72 hours.  Studies/Results: US Abdomen Limited  Result Date: 01/28/2017 CLINICAL DATA:  Right upper quadrant pain for 2 days. EXAM: ULTRASOUND ABDOMEN LIMITED RIGHT UPPER QUADRANT COMPARISON:  08/21/2014 FINDINGS: Gallbladder: Not well visualized due to bowel gas and body habitus. No stones are evident. Gallbladder wall measures upper normal for thickness. The sonographer reports no sonographic Murphy sign. Common bile duct: Diameter: 5 mm Liver: Diffusely increased echogenicity suggests steatosis. Portal vein is patent on color Doppler imaging with normal direction of blood flow towards the liver. IMPRESSION: No definite sonographic findings to suggest cholelithiasis. Electronically Signed   By: Kennith Center M.D.   On: 01/28/2017 13:21    Impression: 1. Right upper quadrant tenderness with positive Murphy's sign on clinical examination. 2. Leukocytosis, abnormal liver enzymes(T bili 6.7/AST 195/ALT 206/alkaline phosphatase 118), lactic acidosis 3. Hepatic steatosis  Plan: Ultrasound shows no cholelithiasis with a normal CBD of 5 mm. However gallbladder was not well visualized due to bowel gas and body habitus.  Recommend MRCP ? Passed CBD stone. Follow LFTs in a.m.  Recommend surgical consult- evaluation for cholecystectomy, patient's presentation typical of biliary colic. She has had intermittent, postprandial right upper quadrant pain for the last 1 year. Continue IV fluid at 1 50 mL an hour, IV morphine for pain, has been started on IV Zosyn.   LOS: 0 days   Kerin Salen, M.D.  01/28/2017, 3:40 PM  Pager [phone] If no answer or after 5 PM call [phone]

## 2017-01-28 NOTE — Consult Note (Signed)
[facility] Surgery Consult Note  Olivia Middleton Apr 01, 1955  [id number].    Requesting MD: Denton Brick, MD Chief Complaint/Reason for Consult: RUQ pain, possible cholecystitis   HPI:  61 y/o obese female with a PMH DM2, HTN, and hepatic steatosis who presented to [facility] with 24 h of RUQ abdominal pain. Pt states pain started after a meal yesterday. States initially she thought it was gas pain so she took gas-x last night which helped a little but her pain returned worse today, bringing her to ED. Denies similar pain in the past or a history of gallstones. Pain is described as sharp, constant, and wraps around her right side towards her back. IV pain maids have relieved her main mildly. Associated sxs include nausea and chills. Denies Hx MI or CVA. Denies use of blood thinners. Former smoker who quit 11 years ago.  Workup significant for leukocytosis 16.5, elevated AST/ALT, total bili of 6.7. RUQ U/S negative for stones or signs of cholecystitis. GI has been consulted and ordered an MRCP.  ROS: Review of Systems  Constitutional: Positive for chills. Negative for fever.  Cardiovascular: Negative for chest pain.  Gastrointestinal: Positive for abdominal pain, diarrhea (has at baseline 2/2 metformin) and nausea.  Genitourinary: Negative.   All other systems reviewed and are negative.   Family History  Problem Relation Age of Onset  . Hypertension Father     Past Medical History:  Diagnosis Date  . Diabetes mellitus   . Hypertension   . Seasonal allergies     Past Surgical History:  Procedure Laterality Date  . ABDOMINAL HYSTERECTOMY    . KNEE ARTHROSCOPY    . NOSE SURGERY    . TUBAL LIGATION      Social History:  reports that she has quit smoking. She does not have any smokeless tobacco history on file. She reports that she does not drink alcohol or use drugs.  Allergies:  Allergies  Allergen Reactions  . Erythromycin   . Tetracyclines & Related      (Not in a hospital  admission)  Blood pressure 107/66, pulse 80, temperature 99.3 F (37.4 C), resp. rate 15, SpO2 96 %. Physical Exam: Physical Exam  Constitutional: She is oriented to person, place, and time. She appears well-developed and well-nourished.  Non-toxic appearance. No distress.  HENT:  Head: Normocephalic and atraumatic.  Mouth/Throat: Oropharynx is clear and moist.  Eyes: EOM are normal. Pupils are equal, round, and reactive to light. Scleral icterus is present.  Cardiovascular: Normal rate, regular rhythm and normal heart sounds. Exam reveals no gallop and no friction rub.  No murmur heard. Pulmonary/Chest: Effort normal and breath sounds normal. No respiratory distress. She has no wheezes. She has no rhonchi. She has no rales.  Abdominal: Soft. Normal appearance and bowel sounds are normal. There is tenderness in the right upper quadrant and epigastric area. There is guarding. There is no rebound and negative Murphy's sign.  Neurological: She is alert and oriented to person, place, and time.  Skin: Skin is warm and dry. No rash noted. She is not diaphoretic.  Psychiatric: She has a normal mood and affect. Her behavior is normal.    Results for orders placed or performed during the hospital encounter of 01/28/17 (from the past 48 hour(s))  Lipase, blood     Status: None   Collection Time: 01/28/17 11:13 AM  Result Value Ref Range   Lipase 31 11 - 51 U/L  Comprehensive metabolic panel     Status: Abnormal  Collection Time: 01/28/17 11:13 AM  Result Value Ref Range   Sodium 138 135 - 145 mmol/L   Potassium 3.8 3.5 - 5.1 mmol/L   Chloride 104 101 - 111 mmol/L   CO2 24 22 - 32 mmol/L   Glucose, Bld 139 (H) 65 - 99 mg/dL   BUN 13 6 - 20 mg/dL   Creatinine, Ser 0.98 0.44 - 1.00 mg/dL   Calcium 9.2 8.9 - 10.3 mg/dL   Total Protein 7.5 6.5 - 8.1 g/dL   Albumin 3.5 3.5 - 5.0 g/dL   AST 195 (H) 15 - 41 U/L   ALT 206 (H) 14 - 54 U/L   Alkaline Phosphatase 118 38 - 126 U/L   Total  Bilirubin 6.7 (H) 0.3 - 1.2 mg/dL   GFR calc non Af Amer >60 >60 mL/min   GFR calc Af Amer >60 >60 mL/min    Comment: (NOTE) The eGFR has been calculated using the CKD EPI equation. This calculation has not been validated in all clinical situations. eGFR's persistently <60 mL/min signify possible Chronic Kidney Disease.    Anion gap 10 5 - 15  CBC     Status: Abnormal   Collection Time: 01/28/17 11:13 AM  Result Value Ref Range   WBC 16.5 (H) 4.0 - 10.5 K/uL   RBC 5.38 (H) 3.87 - 5.11 MIL/uL   Hemoglobin 16.2 (H) 12.0 - 15.0 g/dL   HCT 47.6 (H) 36.0 - 46.0 %   MCV 88.5 78.0 - 100.0 fL   MCH 30.1 26.0 - 34.0 pg   MCHC 34.0 30.0 - 36.0 g/dL   RDW 13.3 11.5 - 15.5 %   Platelets 258 150 - 400 K/uL  I-Stat CG4 Lactic Acid, ED     Status: Abnormal   Collection Time: 01/28/17 11:52 AM  Result Value Ref Range   Lactic Acid, Venous 2.94 (HH) 0.5 - 1.9 mmol/L   Comment NOTIFIED PHYSICIAN   Urinalysis, Routine w reflex microscopic     Status: Abnormal   Collection Time: 01/28/17  1:15 PM  Result Value Ref Range   Color, Urine AMBER (A) YELLOW    Comment: BIOCHEMICALS MAY BE AFFECTED BY COLOR   APPearance HAZY (A) CLEAR   Specific Gravity, Urine 1.016 1.005 - 1.030   pH 5.0 5.0 - 8.0   Glucose, UA NEGATIVE NEGATIVE mg/dL   Hgb urine dipstick SMALL (A) NEGATIVE   Bilirubin Urine MODERATE (A) NEGATIVE   Ketones, ur NEGATIVE NEGATIVE mg/dL   Protein, ur NEGATIVE NEGATIVE mg/dL   Nitrite NEGATIVE NEGATIVE   Leukocytes, UA NEGATIVE NEGATIVE   RBC / HPF 0-5 0 - 5 RBC/hpf   WBC, UA 6-30 0 - 5 WBC/hpf   Bacteria, UA RARE (A) NONE SEEN   Squamous Epithelial / LPF 0-5 (A) NONE SEEN   Renal Epithelial 1    Mucus PRESENT    Hyaline Casts, UA PRESENT    Granular Casts, UA PRESENT   I-Stat CG4 Lactic Acid, ED     Status: Abnormal   Collection Time: 01/28/17  2:19 PM  Result Value Ref Range   Lactic Acid, Venous 2.44 (HH) 0.5 - 1.9 mmol/L   Comment NOTIFIED PHYSICIAN   CBG monitoring, ED      Status: Abnormal   Collection Time: 01/28/17  4:07 PM  Result Value Ref Range   Glucose-Capillary 140 (H) 65 - 99 mg/dL   US Abdomen Limited  Result Date: 01/28/2017 CLINICAL DATA:  Right upper quadrant pain for 2 days. EXAM: ULTRASOUND  ABDOMEN LIMITED RIGHT UPPER QUADRANT COMPARISON:  08/21/2014 FINDINGS: Gallbladder: Not well visualized due to bowel gas and body habitus. No stones are evident. Gallbladder wall measures upper normal for thickness. The sonographer reports no sonographic Murphy sign. Common bile duct: Diameter: 5 mm Liver: Diffusely increased echogenicity suggests steatosis. Portal vein is patent on color Doppler imaging with normal direction of blood flow towards the liver. IMPRESSION: No definite sonographic findings to suggest cholelithiasis. Electronically Signed   By: Misty Stanley M.D.   On: 01/28/2017 13:21      Assessment/Plan RUQ pain - positive Murphy's on previous exams, negative on my exam but s/p IV pain meds. Leukocytosis Abnormal LFT's Hyperbilirubinemia  - possible choledocholithiasis/CBD obstruction, possible cholecystitis  - RUQ U/S without cholelithiasis or evidence of cholecystitis but study poor 2/2 to habitus - agree with MRCP and repeat LFTs in AM - agree with empiric IV abx at this time based on clinical sxs and leukocytosis  - general surgery will follow for continued recommendations regarding possible cholecystectomy this hospital admission.     Jill Alexanders, [facility] 01/28/2017, 4:34 PM Pager: [phone] Consults: [phone] Mon-Fri 7:00 am-4:30 pm Sat-Sun 7:00 am-11:30 am

## 2017-01-28 NOTE — ED Notes (Signed)
Patient transported to Ultrasound 

## 2017-01-28 NOTE — ED Triage Notes (Addendum)
Patient reports two day history of right upper abdominal pain with nausea. Patient still has gallbladder. Patient reports pain and nausea increases with eating, states she has only been able to eat small portions. Patient with right sided tenderness with palpation. No lower abdominal pain tenderness. Patient reports diarrhea.

## 2017-01-29 ENCOUNTER — Encounter (HOSPITAL_COMMUNITY): Payer: Self-pay | Admitting: *Deleted

## 2017-01-29 ENCOUNTER — Inpatient Hospital Stay (HOSPITAL_COMMUNITY): Payer: BC Managed Care – PPO

## 2017-01-29 ENCOUNTER — Other Ambulatory Visit: Payer: Self-pay

## 2017-01-29 DIAGNOSIS — E872 Acidosis: Secondary | ICD-10-CM

## 2017-01-29 DIAGNOSIS — K760 Fatty (change of) liver, not elsewhere classified: Secondary | ICD-10-CM

## 2017-01-29 DIAGNOSIS — R945 Abnormal results of liver function studies: Secondary | ICD-10-CM

## 2017-01-29 DIAGNOSIS — I10 Essential (primary) hypertension: Secondary | ICD-10-CM

## 2017-01-29 DIAGNOSIS — R6510 Systemic inflammatory response syndrome (SIRS) of non-infectious origin without acute organ dysfunction: Secondary | ICD-10-CM

## 2017-01-29 DIAGNOSIS — R1011 Right upper quadrant pain: Secondary | ICD-10-CM

## 2017-01-29 DIAGNOSIS — R8290 Unspecified abnormal findings in urine: Secondary | ICD-10-CM

## 2017-01-29 LAB — COMPREHENSIVE METABOLIC PANEL
ALBUMIN: 2.8 g/dL — AB (ref 3.5–5.0)
ALK PHOS: 103 U/L (ref 38–126)
ALT: 171 U/L — ABNORMAL HIGH (ref 14–54)
AST: 121 U/L — AB (ref 15–41)
Anion gap: 8 (ref 5–15)
BILIRUBIN TOTAL: 7.9 mg/dL — AB (ref 0.3–1.2)
BUN: 14 mg/dL (ref 6–20)
CALCIUM: 8.3 mg/dL — AB (ref 8.9–10.3)
CO2: 24 mmol/L (ref 22–32)
Chloride: 104 mmol/L (ref 101–111)
Creatinine, Ser: 1.02 mg/dL — ABNORMAL HIGH (ref 0.44–1.00)
GFR calc Af Amer: >60 mL/min (ref >60)
GFR, EST NON AFRICAN AMERICAN: 58 mL/min — AB (ref >60)
Glucose, Bld: 150 mg/dL — ABNORMAL HIGH (ref 65–99)
Potassium: 3.6 mmol/L (ref 3.5–5.1)
Sodium: 136 mmol/L (ref 135–145)
TOTAL PROTEIN: 6.2 g/dL — AB (ref 6.5–8.1)

## 2017-01-29 LAB — GLUCOSE, CAPILLARY
GLUCOSE-CAPILLARY: 150 mg/dL — AB (ref 65–99)
GLUCOSE-CAPILLARY: 137 mg/dL — AB (ref 65–99)
GLUCOSE-CAPILLARY: 146 mg/dL — AB (ref 65–99)
Glucose-Capillary: 110 mg/dL — ABNORMAL HIGH (ref 65–99)
Glucose-Capillary: 113 mg/dL — ABNORMAL HIGH (ref 65–99)
Glucose-Capillary: 127 mg/dL — ABNORMAL HIGH (ref 65–99)
Glucose-Capillary: 127 mg/dL — ABNORMAL HIGH (ref 65–99)

## 2017-01-29 LAB — URINE CULTURE: Culture: NO GROWTH

## 2017-01-29 LAB — HEPATITIS PANEL, ACUTE
HCV Ab: <0.1 s/co ratio (ref 0.0–0.9)
Hep A IgM: NEGATIVE
Hep B C IgM: NEGATIVE
Hepatitis B Surface Ag: NEGATIVE

## 2017-01-29 LAB — CBC
HCT: 41.4 % (ref 36.0–46.0)
Hemoglobin: 13.6 g/dL (ref 12.0–15.0)
MCH: 29.3 pg (ref 26.0–34.0)
MCHC: 32.9 g/dL (ref 30.0–36.0)
MCV: 89.2 fL (ref 78.0–100.0)
PLATELETS: 218 10*3/uL (ref 150–400)
RBC: 4.64 MIL/uL (ref 3.87–5.11)
RDW: 13.7 % (ref 11.5–15.5)
WBC: 9.3 10*3/uL (ref 4.0–10.5)

## 2017-01-29 LAB — HIV ANTIBODY (ROUTINE TESTING W REFLEX): HIV Screen 4th Generation wRfx: NONREACTIVE

## 2017-01-29 MED ORDER — GADOBENATE DIMEGLUMINE 529 MG/ML IV SOLN
20.0000 mL | Freq: Once | INTRAVENOUS | Status: AC | PRN
  Administered 2017-01-29: 20 mL via INTRAVENOUS

## 2017-01-29 MED ORDER — CHLORHEXIDINE GLUCONATE 0.12 % MT SOLN
15.0000 mL | Freq: Two times a day (BID) | OROMUCOSAL | Status: DC
  Administered 2017-01-30 – 2017-01-31 (×2): 15 mL via OROMUCOSAL
  Filled 2017-01-29: qty 15

## 2017-01-29 MED ORDER — ORAL CARE MOUTH RINSE
15.0000 mL | Freq: Two times a day (BID) | OROMUCOSAL | Status: DC
  Administered 2017-01-30: 15 mL via OROMUCOSAL

## 2017-01-29 NOTE — Progress Notes (Signed)
Subjective/Chief Complaint: ruq pain   Objective: Vital signs in last 24 hours: Temp:  [98.4 F (36.9 C)-100 F (37.8 C)] 98.6 F (37 C) (11/30 0436) Pulse Rate:  [71-90] 73 (11/30 0436) Resp:  [15-21] 17 (11/30 0436) BP: (92-136)/(47-80) 97/55 (11/30 0436) SpO2:  [92 %-100 %] 92 % (11/30 0436)    Intake/Output from previous day: 11/29 0701 - 11/30 0700 In: 1375 [I.V.:1275; IV Piggyback:100] Out: 650 [Urine:650] Intake/Output this shift: No intake/output data recorded.  GI: soft ruq tender  Lab Results:  Recent Labs    01/28/17 1113 01/29/17 0345  WBC 16.5* 9.3  HGB 16.2* 13.6  HCT 47.6* 41.4  PLT 258 218   BMET Recent Labs    01/28/17 1113 01/29/17 0345  NA 138 136  K 3.8 3.6  CL 104 104  CO2 24 24  GLUCOSE 139* 150*  BUN 13 14  CREATININE 0.98 1.02*  CALCIUM 9.2 8.3*   PT/INR No results for input(s): LABPROT, INR in the last 72 hours. ABG No results for input(s): PHART, HCO3 in the last 72 hours.  Invalid input(s): PCO2, PO2  Studies/Results: Mr 3d Recon At Scanner  Result Date: 01/29/2017 CLINICAL DATA:  Abnormal LFTs EXAM: MRI ABDOMEN WITHOUT AND WITH CONTRAST (INCLUDING MRCP) TECHNIQUE: Multiplanar multisequence MR imaging of the abdomen was performed both before and after the administration of intravenous contrast. Heavily T2-weighted images of the biliary and pancreatic ducts were obtained, and three-dimensional MRCP images were rendered by post processing. CONTRAST:  20mL MULTIHANCE GADOBENATE DIMEGLUMINE 529 MG/ML IV SOLN COMPARISON:  Right upper quadrant ultrasound dated 01/28/2017 FINDINGS: Lower chest: Lung bases are clear. Hepatobiliary: Mild hepatic steatosis with areas of focal fatty sparing. No suspicious/enhancing hepatic lesion is seen. Layering tiny gallstones with gallbladder sludge (series 6/image 20), with associated gallbladder wall thickening. No pericholecystic fluid or inflammatory changes. Fundal adenomyomatosis (series  1502/image 60), benign. No intrahepatic duct dilatation. Common duct measures 7 mm, at the upper limits of normal. However, three mid/distal CBD stones are suspected (series 7/images 18, 25, and 27), measuring up to 5 mm. Associated filling defect in the distal CBD on coronal MRCP (series 9/ image 35). Pancreas:  Within normal limits. Spleen:  Within normal limits. Adrenals/Urinary Tract: Mild thickening of the left adrenal gland. Right adrenal glands within normal limits. Kidneys are within normal limits.  No hydronephrosis. Stomach/Bowel: Stomach is within normal limits. Visualized bowel is grossly unremarkable. Vascular/Lymphatic:  No evidence of abdominal aortic aneurysm. No suspicious abdominal lymphadenopathy. Other:  No abdominal ascites. Musculoskeletal: No focal osseous lesions. IMPRESSION: Cholelithiasis, without associated findings to suggest acute cholecystitis. Suspected choledocholithiasis with three mid/distal CBD stones measuring up to 5 mm. No intrahepatic or extrahepatic ductal dilatation. Consider ERCP as clinically warranted. Mild hepatic steatosis with areas of focal fatty sparing. Electronically Signed   By: Charline BillsSriyesh  Krishnan M.D.   On: 01/29/2017 07:17   [place] Abdomen Limited  Result Date: 01/28/2017 CLINICAL DATA:  Right upper quadrant pain for 2 days. EXAM: ULTRASOUND ABDOMEN LIMITED RIGHT UPPER QUADRANT COMPARISON:  08/21/2014 FINDINGS: Gallbladder: Not well visualized due to bowel gas and body habitus. No stones are evident. Gallbladder wall measures upper normal for thickness. The sonographer reports no sonographic Murphy sign. Common bile duct: Diameter: 5 mm Liver: Diffusely increased echogenicity suggests steatosis. Portal vein is patent on color Doppler imaging with normal direction of blood flow towards the liver. IMPRESSION: No definite sonographic findings to suggest cholelithiasis. Electronically Signed   By: Kennith CenterEric  Mansell M.D.   On:  01/28/2017 13:21   Mr Abdomen Mrcp Vivien RossettiW Wo  Contast  Result Date: 01/29/2017 CLINICAL DATA:  Abnormal LFTs EXAM: MRI ABDOMEN WITHOUT AND WITH CONTRAST (INCLUDING MRCP) TECHNIQUE: Multiplanar multisequence MR imaging of the abdomen was performed both before and after the administration of intravenous contrast. Heavily T2-weighted images of the biliary and pancreatic ducts were obtained, and three-dimensional MRCP images were rendered by post processing. CONTRAST:  20mL MULTIHANCE GADOBENATE DIMEGLUMINE 529 MG/ML IV SOLN COMPARISON:  Right upper quadrant ultrasound dated 01/28/2017 FINDINGS: Lower chest: Lung bases are clear. Hepatobiliary: Mild hepatic steatosis with areas of focal fatty sparing. No suspicious/enhancing hepatic lesion is seen. Layering tiny gallstones with gallbladder sludge (series 6/image 20), with associated gallbladder wall thickening. No pericholecystic fluid or inflammatory changes. Fundal adenomyomatosis (series 1502/image 60), benign. No intrahepatic duct dilatation. Common duct measures 7 mm, at the upper limits of normal. However, three mid/distal CBD stones are suspected (series 7/images 18, 25, and 27), measuring up to 5 mm. Associated filling defect in the distal CBD on coronal MRCP (series 9/ image 35). Pancreas:  Within normal limits. Spleen:  Within normal limits. Adrenals/Urinary Tract: Mild thickening of the left adrenal gland. Right adrenal glands within normal limits. Kidneys are within normal limits.  No hydronephrosis. Stomach/Bowel: Stomach is within normal limits. Visualized bowel is grossly unremarkable. Vascular/Lymphatic:  No evidence of abdominal aortic aneurysm. No suspicious abdominal lymphadenopathy. Other:  No abdominal ascites. Musculoskeletal: No focal osseous lesions. IMPRESSION: Cholelithiasis, without associated findings to suggest acute cholecystitis. Suspected choledocholithiasis with three mid/distal CBD stones measuring up to 5 mm. No intrahepatic or extrahepatic ductal dilatation. Consider ERCP as  clinically warranted. Mild hepatic steatosis with areas of focal fatty sparing. Electronically Signed   By: Charline BillsSriyesh  Krishnan M.D.   On: 01/29/2017 07:17    Anti-infectives: Anti-infectives (From admission, onward)   Start     Dose/Rate Route Frequency Ordered Stop   01/28/17 2000  piperacillin-tazobactam (ZOSYN) IVPB 3.375 g     3.375 g 12.5 mL/hr over 240 Minutes Intravenous Every 8 hours 01/28/17 1510     01/28/17 1230  piperacillin-tazobactam (ZOSYN) IVPB 3.375 g     3.375 g 100 mL/hr over 30 Minutes Intravenous  Once 01/28/17 1226 01/28/17 1452      Assessment/Plan: Choledocholithiasis  -mrcp with cbd stones, bili up -await ercp then will need lap chole  Emelia LoronMatthew Shadi Sessler 01/29/2017

## 2017-01-29 NOTE — Anesthesia Preprocedure Evaluation (Addendum)
Anesthesia Evaluation  Patient identified by MRN, date of birth, ID band Patient awake    Reviewed: Allergy & Precautions, NPO status , Patient's Chart, lab work & pertinent test results  Airway Mallampati: III  TM Distance: <3 FB Neck ROM: Full  Mouth opening: Limited Mouth Opening  Dental  (+) Teeth Intact, Dental Advisory Given   Pulmonary former smoker,    breath sounds clear to auscultation- rhonchi       Cardiovascular hypertension, Pt. on medications  Rhythm:Regular Rate:Normal     Neuro/Psych negative neurological ROS  negative psych ROS   GI/Hepatic negative GI ROS, Neg liver ROS,   Endo/Other  diabetes, Type 2, Oral Hypoglycemic Agents  Renal/GU negative Renal ROS     Musculoskeletal negative musculoskeletal ROS (+)   Abdominal (+) + obese,   Peds  Hematology negative hematology ROS (+)   Anesthesia Other Findings   Reproductive/Obstetrics negative OB ROS                            Anesthesia Physical Anesthesia Plan  ASA: III  Anesthesia Plan: General   Post-op Pain Management:    Induction: Intravenous, Rapid sequence and Cricoid pressure planned  PONV Risk Score and Plan: 4 or greater and Ondansetron, Dexamethasone, Midazolam and Scopolamine patch - Pre-op  Airway Management Planned: Oral ETT  Additional Equipment: None  Intra-op Plan:   Post-operative Plan: Extubation in OR  Informed Consent: I have reviewed the patients History and Physical, chart, labs and discussed the procedure including the risks, benefits and alternatives for the proposed anesthesia with the patient or authorized representative who has indicated his/her understanding and acceptance.   Dental advisory given  Plan Discussed with: CRNA  Anesthesia Plan Comments: (Possible glidescope)       Anesthesia Quick Evaluation

## 2017-01-29 NOTE — Progress Notes (Signed)
Subjective: Nausea. RUQ improving.  Objective: Vital signs in last 24 hours: Temp:  [98.4 F (36.9 C)-100 F (37.8 C)] 98.6 F (37 C) (11/30 0436) Pulse Rate:  [71-90] 73 (11/30 0436) Resp:  [15-21] 17 (11/30 0436) BP: (92-136)/(47-80) 97/55 (11/30 0436) SpO2:  [92 %-100 %] 92 % (11/30 0436) Weight:  [308 lb 13.8 oz (140.1 kg)] 308 lb 13.8 oz (140.1 kg) (11/30 0844) Weight change:    PE: GEN:  NAD, obese HEENT;  Scleral icterus, jaundice ABD:  Obese, protuberant, mild RUQ/EG tenderness without peritonitis  Lab Results: CBC    Component Value Date/Time   WBC 9.3 01/29/2017 0345   RBC 4.64 01/29/2017 0345   HGB 13.6 01/29/2017 0345   HCT 41.4 01/29/2017 0345   PLT 218 01/29/2017 0345   MCV 89.2 01/29/2017 0345   MCH 29.3 01/29/2017 0345   MCHC 32.9 01/29/2017 0345   RDW 13.7 01/29/2017 0345   LYMPHSABS 3.6 05/06/2011 0745   MONOABS 0.7 05/06/2011 0745   EOSABS 0.2 05/06/2011 0745   BASOSABS 0.1 05/06/2011 0745   CMP     Component Value Date/Time   NA 136 01/29/2017 0345   K 3.6 01/29/2017 0345   CL 104 01/29/2017 0345   CO2 24 01/29/2017 0345   GLUCOSE 150 (H) 01/29/2017 0345   BUN 14 01/29/2017 0345   CREATININE 1.02 (H) 01/29/2017 0345   CREATININE 0.79 07/11/2015 0840   CALCIUM 8.3 (L) 01/29/2017 0345   PROT 6.2 (L) 01/29/2017 0345   ALBUMIN 2.8 (L) 01/29/2017 0345   AST 121 (H) 01/29/2017 0345   ALT 171 (H) 01/29/2017 0345   ALKPHOS 103 01/29/2017 0345   BILITOT 7.9 (H) 01/29/2017 0345   GFRNONAA 58 (L) 01/29/2017 0345   GFRNONAA 82 07/11/2015 0840   GFRAA >60 01/29/2017 0345   GFRAA >89 07/11/2015 0840    Studies/Results: MRCP: CBD stones, personally reviewed  Assessment:  1.  RUQ pain, improving. 2.  Elevated LFTs. 3.  Choledocholithiasis on MRCP.  Plan:  1.  NPO after midnight. 2.  Continue IV fluids. 3.  Continue antibiotics. 4.  ERCP tomorrow 0730. 5.  Risks (up to and including bleeding, infection, perforation, pancreatitis that  can be complicated by infected necrosis and death), benefits (removal of stones, alleviating blockage, decreasing risk of cholangitis or choledocholithiasis-related pancreatitis), and alternatives (watchful waiting, percutaneous transhepatic cholangiography) of ERCP were explained to patient/family in detail and patient elects to proceed.   Freddy JakschOUTLAW,Mercedes Fort M 01/29/2017, 11:06 AM   Cell [phone] If no answer or after 5 PM call [phone]

## 2017-01-29 NOTE — Progress Notes (Signed)
PROGRESS NOTE    Olivia Middleton  ZOX:[id number] DOB: [birth year] DOA: 01/28/2017 PCP: Maurice Small, MD   Brief Narrative:  Olivia Middleton is a 61 y.o. female with medical history significant for Hepatic Steatosis, Diabetes on metformin, Hypertension, and Obesity.  Patient reported that yesterday she had mild right upper quadrant abdominal pain that she felt was related to gas.  She took a Gas-X pill and felt much better after laying down and resting.  She was quite nauseated at the same time.Yesterday morning she was having no symptoms. She ate breakfast and had no symptoms immediately following eating.  She went to work as usual and after being at work about an hour she developed severe right upper quadrant abdominal pain that worsened with deep breath and was quite nauseous.  She went to have a bowel movement and began having diarrhea that was not typical for her baseline.  Because of the severity of symptoms she presented to the ER.  She was worked up for her RUQ pain and found to have Choledocholithiasis and Gastroenterology and General Surgery were consulted for further evaluation and recommendations. Patient to undergo ERCP in AM and likely Lap Chole later this admission.   Assessment & Plan:   Principal Problem:   Choledocholithiasis Active Problems:   RUQ abdominal pain   Diabetes mellitus   Hypertension   SIRS (systemic inflammatory response syndrome) (HCC)   Abnormal urinalysis   Hepatic steatosis  RUQ abdominal pain with Choledocholithiasis -Patient presented with abrupt onset of abdominal pain x 2- worst episode today after eating fatty meal with elevation in total bilirubin and ALT/AST concerning for choledocholithiasis -Patient's body habitus and bowel gas interfering with adequate abdominal ultrasound and therefore undetected cholelithiasis may be present; Ultrasound of the Gallbladder done and showed o definite sonographic findings to suggest cholelithiasis -MRCP done and  showed Cholelithiasis, without associated findings to suggest acute cholecystitis. Suspected choledocholithiasis with three mid/distal CBD stones measuring up to 5 mm. No intrahepatic or extrahepatic ductal dilatation. Mild hepatic steatosis with areas of focal fatty sparing. -GI Consulted and recommending ERCP in AM and General Surgery Consult for Laparoscopic Cholecystectomy   -Clear Liquid Diet for now and NPO at Midnight for ERCP -C/w Acetaminophen 650 mg po q6hprn mild pain/fever and IV Morphine 1-4 mg q2hprn for pain -IV Zofran 4 mg q6hprn for Nausea -IV Zosyn for SIRS physiology/possible early Ascending Cholangitis -Blood Cultures x2 obtained and pending  -Hepatitis panel Negative -General Surgery Consulted and liklely will proceed with Lap Chole after ERCP; Will defer timing to General Surgery of when this will take place  SIRS (Systemic Inflammatory Response Syndrome) 2/2 to suspected Obstructive Choledocholithiasis  -Patient presents with acute abdominal pain with significant leukocytosis and elevated lactic acid with source of infection suspected to be gallbladder -Cycled lactic acid and trended down -WBC went from 16.5 -> 9.3 -C/w IVFluid Resuscitation with NS at 150 mL/hr -Empiric Antibiotics with IV Zosyn to cover gram-negative organisms -Follow urine output and vital signs closely-currently did not meet criteria for sepsis  Lactic Acidosis  -Patient's Lactic Acid Level improved from 2.44 -> 1.6 -C/w IVF Rehydration with NS at 150 mL/hr  Abnormal LFT's/Transaminitis  -Likely 2/2 Above -AST went from 195 -> 121 -ALT went from 206 -> 171 -Likely 2/2 to Obstructive Choledocholithiasis  -Repeat CMP in AM -Patient to undergo ERCP in AM  Hyperbilirubinemia -Patient's T Bili went from 6.7 -> 7.9 -Likely from Obstructive Choledocholithiasis   Diabetes Mellitus -Hold Home metformin acutely -C/w Sensitive Novolog SSI  q4h -HgbA1c done and was 6.5 -CBG's ranging from  127-160  Essential Hypertension -Current blood pressure suboptimal in setting of gram-negative SIRS/? Evolving sepsis therefore holding home antihypertensive medications of Amlodipine 5 mg po Daily and Lisinopril 5 mg po Daily -Continue to Monitor   Hepatic Steatosis -Ultrasound of the Liver showed Diffusely increased echogenicity suggests steatosis. Portal vein is patent on color Doppler imaging with normal direction of blood flow towards the liver. -MRCP showed mild hepatic steatosis with areas of focal fatty sparing. -At baseline transaminases have been normal -Repeat CMP in AM  -Encourage Weight Loss and Diet Counseling   Abnormal Urinalysis -Urinalysis showed Hazy Appearance, Moderate Bilirubin, Small Hgb, Rare Bacteria, 6-30 WBC's -Obtain Urine Culture and pending -Empiric antibiotics as above  Mildly Elevated Cr -Patient's Cr worsened slightly 0.98 -> 1.02 -Unclear Baseline -C/w IVF as above  DVT prophylaxis: Enoxaparin 40 mg sq q24h Code Status: FULL CODE Family Communication: Discussed with Family present at bedside Disposition Plan: Remain Inpatient for ERCP and likely Lap Chole this Admission   Consultants:   Gastroenterology   General Surgery   Procedures: MRCP   Antimicrobials:  Anti-infectives (From admission, onward)   Start     Dose/Rate Route Frequency Ordered Stop   01/28/17 2000  piperacillin-tazobactam (ZOSYN) IVPB 3.375 g     3.375 g 12.5 mL/hr over 240 Minutes Intravenous Every 8 hours 01/28/17 1510     01/28/17 1230  piperacillin-tazobactam (ZOSYN) IVPB 3.375 g     3.375 g 100 mL/hr over 30 Minutes Intravenous  Once 01/28/17 1226 01/28/17 1452     Subjective: Seen and examined at bedside and states abdominal pain was improved but she was still nauseous. No CP or SOB. No other concerns or complaints at this time.   Objective: Vitals:   01/28/17 1845 01/28/17 2216 01/29/17 0436 01/29/17 0844  BP: 136/65 (!) 97/53 (!) 97/55   Pulse: 78 73  73   Resp: (!) 21 17 17    Temp: 100 F (37.8 C) 99.4 F (37.4 C) 98.6 F (37 C)   TempSrc: Oral Oral    SpO2: 94% 93% 92%   Weight:    (!) 140.1 kg (308 lb 13.8 oz)    Intake/Output Summary (Last 24 hours) at 01/29/2017 1157 Last data filed at 01/29/2017 0600 Gross per 24 hour  Intake 1375 ml  Output 650 ml  Net 725 ml   Filed Weights   01/29/17 0844  Weight: (!) 140.1 kg (308 lb 13.8 oz)   Examination: Physical Exam:  Constitutional: WN/WD obese jaundiced appearing Caucasian female in NAD and appears calm  Eyes: Lids and conjunctivae normal, sclerae icteric  ENMT: External Ears, Nose appear normal. Grossly normal hearing. Mucous membranes are moist. Neck: Appears normal, supple, no cervical masses, normal ROM, no appreciable thyromegaly; no JVD Respiratory: Diminished to auscultation bilaterally, no wheezing, rales, rhonchi or crackles. Normal respiratory effort and patient is not tachypenic. No accessory muscle use.  Cardiovascular: RRR, no murmurs / rubs / gallops. S1 and S2 auscultated. No extremity edema.  Abdomen: Soft, Tender to palpate in RUQ and Mid Epigastrum, Distended 2/2 body habitus. No masses palpated. No appreciable hepatosplenomegaly. Bowel sounds positive x4.  GU: Deferred. Musculoskeletal: No clubbing / cyanosis of digits/nails. No joint deformity upper and lower extremities.  Skin: No rashes, lesions, ulcers on a limited skin eval. No induration; Warm and dry.  Neurologic: CN 2-12 grossly intact with no focal deficits. Romberg sign cerebellar reflexes not assessed.  Psychiatric: Normal judgment and insight.  Alert and oriented x 3. Pleasant and Normal mood and appropriate affect.   Data Reviewed: I have personally reviewed following labs and imaging studies  CBC: Recent Labs  Lab 01/28/17 1113 01/29/17 0345  WBC 16.5* 9.3  HGB 16.2* 13.6  HCT 47.6* 41.4  MCV 88.5 89.2  PLT 258 218   Basic Metabolic Panel: Recent Labs  Lab 01/28/17 1113  01/29/17 0345  NA 138 136  K 3.8 3.6  CL 104 104  CO2 24 24  GLUCOSE 139* 150*  BUN 13 14  CREATININE 0.98 1.02*  CALCIUM 9.2 8.3*   GFR: CrCl cannot be calculated (Unknown ideal weight.). Liver Function Tests: Recent Labs  Lab 01/28/17 1113 01/29/17 0345  AST 195* 121*  ALT 206* 171*  ALKPHOS 118 103  BILITOT 6.7* 7.9*  PROT 7.5 6.2*  ALBUMIN 3.5 2.8*   Recent Labs  Lab 01/28/17 1113  LIPASE 31   No results for input(s): AMMONIA in the last 168 hours. Coagulation Profile: No results for input(s): INR, PROTIME in the last 168 hours. Cardiac Enzymes: No results for input(s): CKTOTAL, CKMB, CKMBINDEX, TROPONINI in the last 168 hours. BNP (last 3 results) No results for input(s): PROBNP in the last 8760 hours. HbA1C: Recent Labs    01/28/17 1621  HGBA1C 6.5*   CBG: Recent Labs  Lab 01/28/17 1607 01/28/17 2011 01/29/17 0006 01/29/17 0406 01/29/17 0757  GLUCAP 140* 160* 127* 150* 137*   Lipid Profile: No results for input(s): CHOL, HDL, LDLCALC, TRIG, CHOLHDL, LDLDIRECT in the last 72 hours. Thyroid Function Tests: No results for input(s): TSH, T4TOTAL, FREET4, T3FREE, THYROIDAB in the last 72 hours. Anemia Panel: No results for input(s): VITAMINB12, FOLATE, FERRITIN, TIBC, IRON, RETICCTPCT in the last 72 hours. Sepsis Labs: Recent Labs  Lab 01/28/17 1152 01/28/17 1419 01/28/17 1631 01/28/17 2017  LATICACIDVEN 2.94* 2.44* 1.4 1.6   No results found for this or any previous visit (from the past 240 hour(s)).   Radiology Studies: Mr 3d Recon At Scanner  Result Date: 01/29/2017 CLINICAL DATA:  Abnormal LFTs EXAM: MRI ABDOMEN WITHOUT AND WITH CONTRAST (INCLUDING MRCP) TECHNIQUE: Multiplanar multisequence MR imaging of the abdomen was performed both before and after the administration of intravenous contrast. Heavily T2-weighted images of the biliary and pancreatic ducts were obtained, and three-dimensional MRCP images were rendered by post processing.  CONTRAST:  20mL MULTIHANCE GADOBENATE DIMEGLUMINE 529 MG/ML IV SOLN COMPARISON:  Right upper quadrant ultrasound dated 01/28/2017 FINDINGS: Lower chest: Lung bases are clear. Hepatobiliary: Mild hepatic steatosis with areas of focal fatty sparing. No suspicious/enhancing hepatic lesion is seen. Layering tiny gallstones with gallbladder sludge (series 6/image 20), with associated gallbladder wall thickening. No pericholecystic fluid or inflammatory changes. Fundal adenomyomatosis (series 1502/image 60), benign. No intrahepatic duct dilatation. Common duct measures 7 mm, at the upper limits of normal. However, three mid/distal CBD stones are suspected (series 7/images 18, 25, and 27), measuring up to 5 mm. Associated filling defect in the distal CBD on coronal MRCP (series 9/ image 35). Pancreas:  Within normal limits. Spleen:  Within normal limits. Adrenals/Urinary Tract: Mild thickening of the left adrenal gland. Right adrenal glands within normal limits. Kidneys are within normal limits.  No hydronephrosis. Stomach/Bowel: Stomach is within normal limits. Visualized bowel is grossly unremarkable. Vascular/Lymphatic:  No evidence of abdominal aortic aneurysm. No suspicious abdominal lymphadenopathy. Other:  No abdominal ascites. Musculoskeletal: No focal osseous lesions. IMPRESSION: Cholelithiasis, without associated findings to suggest acute cholecystitis. Suspected choledocholithiasis with three mid/distal CBD stones measuring up  to 5 mm. No intrahepatic or extrahepatic ductal dilatation. Consider ERCP as clinically warranted. Mild hepatic steatosis with areas of focal fatty sparing. Electronically Signed   By: Charline Bills M.D.   On: 01/29/2017 07:17   US Abdomen Limited  Result Date: 01/28/2017 CLINICAL DATA:  Right upper quadrant pain for 2 days. EXAM: ULTRASOUND ABDOMEN LIMITED RIGHT UPPER QUADRANT COMPARISON:  08/21/2014 FINDINGS: Gallbladder: Not well visualized due to bowel gas and body habitus. No  stones are evident. Gallbladder wall measures upper normal for thickness. The sonographer reports no sonographic Murphy sign. Common bile duct: Diameter: 5 mm Liver: Diffusely increased echogenicity suggests steatosis. Portal vein is patent on color Doppler imaging with normal direction of blood flow towards the liver. IMPRESSION: No definite sonographic findings to suggest cholelithiasis. Electronically Signed   By: Kennith Center M.D.   On: 01/28/2017 13:21   Mr Abdomen Mrcp Vivien Rossetti Contast  Result Date: 01/29/2017 CLINICAL DATA:  Abnormal LFTs EXAM: MRI ABDOMEN WITHOUT AND WITH CONTRAST (INCLUDING MRCP) TECHNIQUE: Multiplanar multisequence MR imaging of the abdomen was performed both before and after the administration of intravenous contrast. Heavily T2-weighted images of the biliary and pancreatic ducts were obtained, and three-dimensional MRCP images were rendered by post processing. CONTRAST:  20mL MULTIHANCE GADOBENATE DIMEGLUMINE 529 MG/ML IV SOLN COMPARISON:  Right upper quadrant ultrasound dated 01/28/2017 FINDINGS: Lower chest: Lung bases are clear. Hepatobiliary: Mild hepatic steatosis with areas of focal fatty sparing. No suspicious/enhancing hepatic lesion is seen. Layering tiny gallstones with gallbladder sludge (series 6/image 20), with associated gallbladder wall thickening. No pericholecystic fluid or inflammatory changes. Fundal adenomyomatosis (series 1502/image 60), benign. No intrahepatic duct dilatation. Common duct measures 7 mm, at the upper limits of normal. However, three mid/distal CBD stones are suspected (series 7/images 18, 25, and 27), measuring up to 5 mm. Associated filling defect in the distal CBD on coronal MRCP (series 9/ image 35). Pancreas:  Within normal limits. Spleen:  Within normal limits. Adrenals/Urinary Tract: Mild thickening of the left adrenal gland. Right adrenal glands within normal limits. Kidneys are within normal limits.  No hydronephrosis. Stomach/Bowel:  Stomach is within normal limits. Visualized bowel is grossly unremarkable. Vascular/Lymphatic:  No evidence of abdominal aortic aneurysm. No suspicious abdominal lymphadenopathy. Other:  No abdominal ascites. Musculoskeletal: No focal osseous lesions. IMPRESSION: Cholelithiasis, without associated findings to suggest acute cholecystitis. Suspected choledocholithiasis with three mid/distal CBD stones measuring up to 5 mm. No intrahepatic or extrahepatic ductal dilatation. Consider ERCP as clinically warranted. Mild hepatic steatosis with areas of focal fatty sparing. Electronically Signed   By: Charline Bills M.D.   On: 01/29/2017 07:17   Scheduled Meds: . chlorhexidine  15 mL Mouth Rinse BID  . enoxaparin (LOVENOX) injection  40 mg Subcutaneous Q24H  . insulin aspart  0-9 Units Subcutaneous Q4H  . mouth rinse  15 mL Mouth Rinse q12n4p  . sodium chloride flush  3 mL Intravenous Q12H   Continuous Infusions: . sodium chloride    . piperacillin-tazobactam (ZOSYN)  IV Stopped (01/29/17 1610)    LOS: 1 day   Merlene Laughter, DO [facility] Pager [phone]  If 7PM-7AM, please contact night-coverage [email or web address] Password [email or web address] 01/29/2017, 11:57 AM

## 2017-01-30 ENCOUNTER — Encounter (HOSPITAL_COMMUNITY)
Admission: EM | Disposition: A | Discharge status: Home / Self Care | Payer: Self-pay | Source: Home / Self Care | Attending: Internal Medicine

## 2017-01-30 ENCOUNTER — Inpatient Hospital Stay (HOSPITAL_COMMUNITY): Payer: BC Managed Care – PPO | Admitting: Anesthesiology

## 2017-01-30 ENCOUNTER — Inpatient Hospital Stay (HOSPITAL_COMMUNITY): Payer: BC Managed Care – PPO

## 2017-01-30 ENCOUNTER — Encounter (HOSPITAL_COMMUNITY): Payer: Self-pay | Admitting: Certified Registered"

## 2017-01-30 HISTORY — PX: ERCP: SHX5425

## 2017-01-30 LAB — COMPREHENSIVE METABOLIC PANEL WITH GFR
ALT: 146 U/L — ABNORMAL HIGH (ref 14–54)
AST: 91 U/L — ABNORMAL HIGH (ref 15–41)
Albumin: 2.8 g/dL — ABNORMAL LOW (ref 3.5–5.0)
Alkaline Phosphatase: 110 U/L (ref 38–126)
Anion gap: 8 (ref 5–15)
BUN: 8 mg/dL (ref 6–20)
CO2: 23 mmol/L (ref 22–32)
Calcium: 8.4 mg/dL — ABNORMAL LOW (ref 8.9–10.3)
Chloride: 108 mmol/L (ref 101–111)
Creatinine, Ser: 0.85 mg/dL (ref 0.44–1.00)
GFR calc Af Amer: >60 mL/min
GFR calc non Af Amer: >60 mL/min
Glucose, Bld: 123 mg/dL — ABNORMAL HIGH (ref 65–99)
Potassium: 3.6 mmol/L (ref 3.5–5.1)
Sodium: 139 mmol/L (ref 135–145)
Total Bilirubin: 5.3 mg/dL — ABNORMAL HIGH (ref 0.3–1.2)
Total Protein: 6.3 g/dL — ABNORMAL LOW (ref 6.5–8.1)

## 2017-01-30 LAB — CBC WITH DIFFERENTIAL/PLATELET
Basophils Absolute: 0.1 10*3/uL (ref 0.0–0.1)
Basophils Relative: 1 %
EOS PCT: 3 %
Eosinophils Absolute: 0.2 10*3/uL (ref 0.0–0.7)
HEMATOCRIT: 41 % (ref 36.0–46.0)
Hemoglobin: 13.6 g/dL (ref 12.0–15.0)
LYMPHS ABS: 2.7 10*3/uL (ref 0.7–4.0)
LYMPHS PCT: 39 %
MCH: 29.6 pg (ref 26.0–34.0)
MCHC: 33.2 g/dL (ref 30.0–36.0)
MCV: 89.1 fL (ref 78.0–100.0)
MONO ABS: 0.8 10*3/uL (ref 0.1–1.0)
Monocytes Relative: 12 %
NEUTROS ABS: 3.1 10*3/uL (ref 1.7–7.7)
Neutrophils Relative %: 45 %
PLATELETS: 224 10*3/uL (ref 150–400)
RBC: 4.6 MIL/uL (ref 3.87–5.11)
RDW: 13.7 % (ref 11.5–15.5)
WBC: 6.8 10*3/uL (ref 4.0–10.5)

## 2017-01-30 LAB — GLUCOSE, CAPILLARY
GLUCOSE-CAPILLARY: 124 mg/dL — AB (ref 65–99)
GLUCOSE-CAPILLARY: 181 mg/dL — AB (ref 65–99)
Glucose-Capillary: 171 mg/dL — ABNORMAL HIGH (ref 65–99)
Glucose-Capillary: 193 mg/dL — ABNORMAL HIGH (ref 65–99)

## 2017-01-30 LAB — SURGICAL PCR SCREEN
MRSA, PCR: NEGATIVE
STAPHYLOCOCCUS AUREUS: NEGATIVE

## 2017-01-30 LAB — MAGNESIUM: MAGNESIUM: 1.6 mg/dL — AB (ref 1.7–2.4)

## 2017-01-30 LAB — PHOSPHORUS: Phosphorus: 2.8 mg/dL (ref 2.5–4.6)

## 2017-01-30 SURGERY — ERCP, WITH INTERVENTION IF INDICATED
Anesthesia: General

## 2017-01-30 MED ORDER — ALPRAZOLAM 0.5 MG PO TABS
1.0000 mg | ORAL_TABLET | Freq: Once | ORAL | Status: AC
  Administered 2017-01-30: 1 mg via ORAL
  Filled 2017-01-30 (×2): qty 2

## 2017-01-30 MED ORDER — ONDANSETRON HCL 4 MG/2ML IJ SOLN
INTRAMUSCULAR | Status: DC | PRN
  Administered 2017-01-30: 4 mg via INTRAVENOUS

## 2017-01-30 MED ORDER — SODIUM CHLORIDE 0.9 % IV SOLN: INTRAVENOUS | Status: DC

## 2017-01-30 MED ORDER — MAGNESIUM SULFATE IN D5W 1-5 GM/100ML-% IV SOLN
1.0000 g | Freq: Once | INTRAVENOUS | Status: AC
  Administered 2017-01-30: 1 g via INTRAVENOUS
  Filled 2017-01-30: qty 100

## 2017-01-30 MED ORDER — IOPAMIDOL (ISOVUE-300) INJECTION 61%
INTRAVENOUS | Status: DC | PRN
  Administered 2017-01-30: 10 mL via INTRAVENOUS

## 2017-01-30 MED ORDER — ROCURONIUM BROMIDE 10 MG/ML (PF) SYRINGE
PREFILLED_SYRINGE | INTRAVENOUS | Status: DC | PRN
  Administered 2017-01-30: 40 mg via INTRAVENOUS

## 2017-01-30 MED ORDER — LACTATED RINGERS IV SOLN: INTRAVENOUS | Status: DC

## 2017-01-30 MED ORDER — MEPERIDINE HCL 100 MG/ML IJ SOLN: 6.2500 mg | INTRAMUSCULAR | Status: DC | PRN

## 2017-01-30 MED ORDER — MIDAZOLAM HCL 2 MG/2ML IJ SOLN
INTRAMUSCULAR | Status: DC | PRN
  Administered 2017-01-30: 2 mg via INTRAVENOUS

## 2017-01-30 MED ORDER — PROMETHAZINE HCL 25 MG/ML IJ SOLN: 6.2500 mg | INTRAMUSCULAR | Status: DC | PRN

## 2017-01-30 MED ORDER — SUCCINYLCHOLINE CHLORIDE 20 MG/ML IJ SOLN
INTRAMUSCULAR | Status: DC | PRN
  Administered 2017-01-30: 140 mg via INTRAVENOUS

## 2017-01-30 MED ORDER — SUGAMMADEX SODIUM 200 MG/2ML IV SOLN
INTRAVENOUS | Status: DC | PRN
  Administered 2017-01-30: 500 mg via INTRAVENOUS

## 2017-01-30 MED ORDER — PROPOFOL 10 MG/ML IV BOLUS
INTRAVENOUS | Status: DC | PRN
  Administered 2017-01-30: 170 mg via INTRAVENOUS

## 2017-01-30 MED ORDER — IOPAMIDOL (ISOVUE-300) INJECTION 61%
INTRAVENOUS | Status: AC
  Filled 2017-01-30: qty 50

## 2017-01-30 MED ORDER — FENTANYL CITRATE (PF) 100 MCG/2ML IJ SOLN: 25.0000 ug | INTRAMUSCULAR | Status: DC | PRN

## 2017-01-30 MED ORDER — INDOMETHACIN 50 MG RE SUPP: 100.0000 mg | Freq: Once | RECTAL | Status: DC

## 2017-01-30 MED ORDER — LIDOCAINE 2% (20 MG/ML) 5 ML SYRINGE
INTRAMUSCULAR | Status: DC | PRN
  Administered 2017-01-30: 60 mg via INTRAVENOUS

## 2017-01-30 MED ORDER — DEXAMETHASONE SODIUM PHOSPHATE 10 MG/ML IJ SOLN
INTRAMUSCULAR | Status: DC | PRN
  Administered 2017-01-30: 10 mg via INTRAVENOUS

## 2017-01-30 MED ORDER — GLUCAGON HCL RDNA (DIAGNOSTIC) 1 MG IJ SOLR
INTRAMUSCULAR | Status: AC
  Filled 2017-01-30: qty 1

## 2017-01-30 MED ORDER — FENTANYL CITRATE (PF) 100 MCG/2ML IJ SOLN
INTRAMUSCULAR | Status: DC | PRN
  Administered 2017-01-30: 50 ug via INTRAVENOUS

## 2017-01-30 MED ORDER — INDOMETHACIN 50 MG RE SUPP
RECTAL | Status: AC
  Filled 2017-01-30: qty 2

## 2017-01-30 MED ORDER — SCOPOLAMINE 1 MG/3DAYS TD PT72
MEDICATED_PATCH | TRANSDERMAL | Status: AC
  Filled 2017-01-30: qty 1

## 2017-01-30 MED ORDER — SCOPOLAMINE 1 MG/3DAYS TD PT72
MEDICATED_PATCH | TRANSDERMAL | Status: DC | PRN
  Administered 2017-01-30: 1 via TRANSDERMAL

## 2017-01-30 NOTE — Progress Notes (Signed)
[facility]/Trauma Progress Note  Day of Surgery   Assessment/Plan Choledocholithiasis  Abnormal LFT's Hyperbilirubinemia  - RUQ U/S without cholelithiasis or evidence of cholecystitis but study poor 2/2 to habitus - MRCP 11/30 showed cholelithiasis and suspected choledocholithiasis - ERCP 12/01   FEN: clears then NPO at midnight VTE: SCD's, lovenox ID: [email or web address] 11/29>> Foley: none Follow up: TBD  DISPO: needs cholecystectomy, likely tomorrow 12/2    LOS: 2 days    Subjective:  CC: RUQ abdominal pain  Pt states pain is very mild and improved. No nausea or vomiting. Tolerating clears. No new complaints. Husband at bedside  Objective: Vital signs in last 24 hours: Temp:  [97 F (36.1 C)-98.6 F (37 C)] 98.2 F (36.8 C) (12/01 0925) Pulse Rate:  [66-79] 67 (12/01 0925) Resp:  [14-19] 18 (12/01 0925) BP: (110-178)/(51-83) 160/74 (12/01 0925) SpO2:  [94 %-99 %] 96 % (12/01 0925) Weight:  [307 lb 12.2 oz (139.6 kg)] 307 lb 12.2 oz (139.6 kg) (12/01 0700) Last BM Date: 01/28/17  Intake/Output from previous day: 11/30 0701 - 12/01 0700 In: 1532.5 [I.V.:1432.5; IV Piggyback:100] Out: 1100 [Urine:1100] Intake/Output this shift: Total I/O In: 120 [P.O.:120] Out: 0   PE: Gen:  Alert, NAD, pleasant, cooperative Card:  RRR, no M/G/R heard Pulm:  Rate and effort normal Abd: Soft, obese, not distended, +BS, no HSM, TTP in RUQ, no guarding Skin: no rashes noted, warm and dry   Anti-infectives: Anti-infectives (From admission, onward)   Start     Dose/Rate Route Frequency Ordered Stop   01/28/17 2000  piperacillin-tazobactam (ZOSYN) IVPB 3.375 g     3.375 g 12.5 mL/hr over 240 Minutes Intravenous Every 8 hours 01/28/17 1510     01/28/17 1230  piperacillin-tazobactam (ZOSYN) IVPB 3.375 g     3.375 g 100 mL/hr over 30 Minutes Intravenous  Once 01/28/17 1226 01/28/17 1452      Lab Results:  Recent Labs    01/29/17 0345 01/30/17 0432  WBC 9.3 6.8   HGB 13.6 13.6  HCT 41.4 41.0  PLT 218 224   BMET Recent Labs    01/29/17 0345 01/30/17 0432  NA 136 139  K 3.6 3.6  CL 104 108  CO2 24 23  GLUCOSE 150* 123*  BUN 14 8  CREATININE 1.02* 0.85  CALCIUM 8.3* 8.4*   PT/INR No results for input(s): LABPROT, INR in the last 72 hours. CMP     Component Value Date/Time   NA 139 01/30/2017 0432   K 3.6 01/30/2017 0432   CL 108 01/30/2017 0432   CO2 23 01/30/2017 0432   GLUCOSE 123 (H) 01/30/2017 0432   BUN 8 01/30/2017 0432   CREATININE 0.85 01/30/2017 0432   CREATININE 0.79 07/11/2015 0840   CALCIUM 8.4 (L) 01/30/2017 0432   PROT 6.3 (L) 01/30/2017 0432   ALBUMIN 2.8 (L) 01/30/2017 0432   AST 91 (H) 01/30/2017 0432   ALT 146 (H) 01/30/2017 0432   ALKPHOS 110 01/30/2017 0432   BILITOT 5.3 (H) 01/30/2017 0432   GFRNONAA >60 01/30/2017 0432   GFRNONAA 82 07/11/2015 0840   GFRAA >60 01/30/2017 0432   GFRAA >89 07/11/2015 0840   Lipase     Component Value Date/Time   LIPASE 31 01/28/2017 1113    Studies/Results: Mr 3d Recon At Scanner  Result Date: 01/29/2017 CLINICAL DATA:  Abnormal LFTs EXAM: MRI ABDOMEN WITHOUT AND WITH CONTRAST (INCLUDING MRCP) TECHNIQUE: Multiplanar multisequence MR imaging of the abdomen was performed both before and after the  administration of intravenous contrast. Heavily T2-weighted images of the biliary and pancreatic ducts were obtained, and three-dimensional MRCP images were rendered by post processing. CONTRAST:  20mL MULTIHANCE GADOBENATE DIMEGLUMINE 529 MG/ML IV SOLN COMPARISON:  Right upper quadrant ultrasound dated 01/28/2017 FINDINGS: Lower chest: Lung bases are clear. Hepatobiliary: Mild hepatic steatosis with areas of focal fatty sparing. No suspicious/enhancing hepatic lesion is seen. Layering tiny gallstones with gallbladder sludge (series 6/image 20), with associated gallbladder wall thickening. No pericholecystic fluid or inflammatory changes. Fundal adenomyomatosis (series 1502/image  60), benign. No intrahepatic duct dilatation. Common duct measures 7 mm, at the upper limits of normal. However, three mid/distal CBD stones are suspected (series 7/images 18, 25, and 27), measuring up to 5 mm. Associated filling defect in the distal CBD on coronal MRCP (series 9/ image 35). Pancreas:  Within normal limits. Spleen:  Within normal limits. Adrenals/Urinary Tract: Mild thickening of the left adrenal gland. Right adrenal glands within normal limits. Kidneys are within normal limits.  No hydronephrosis. Stomach/Bowel: Stomach is within normal limits. Visualized bowel is grossly unremarkable. Vascular/Lymphatic:  No evidence of abdominal aortic aneurysm. No suspicious abdominal lymphadenopathy. Other:  No abdominal ascites. Musculoskeletal: No focal osseous lesions. IMPRESSION: Cholelithiasis, without associated findings to suggest acute cholecystitis. Suspected choledocholithiasis with three mid/distal CBD stones measuring up to 5 mm. No intrahepatic or extrahepatic ductal dilatation. Consider ERCP as clinically warranted. Mild hepatic steatosis with areas of focal fatty sparing. Electronically Signed   By: Charline Bills M.D.   On: 01/29/2017 07:17   US Abdomen Limited  Result Date: 01/28/2017 CLINICAL DATA:  Right upper quadrant pain for 2 days. EXAM: ULTRASOUND ABDOMEN LIMITED RIGHT UPPER QUADRANT COMPARISON:  08/21/2014 FINDINGS: Gallbladder: Not well visualized due to bowel gas and body habitus. No stones are evident. Gallbladder wall measures upper normal for thickness. The sonographer reports no sonographic Murphy sign. Common bile duct: Diameter: 5 mm Liver: Diffusely increased echogenicity suggests steatosis. Portal vein is patent on color Doppler imaging with normal direction of blood flow towards the liver. IMPRESSION: No definite sonographic findings to suggest cholelithiasis. Electronically Signed   By: Kennith Center M.D.   On: 01/28/2017 13:21   Dg Ercp With  Sphincterotomy  Result Date: 01/30/2017 CLINICAL DATA:  Suggestion of choledocholithiasis by MRCP. EXAM: ERCP TECHNIQUE: Multiple spot images obtained with the fluoroscopic device and submitted for interpretation post-procedure. COMPARISON:  MRCP study on 01/29/2017 FINDINGS: Images submitted during the ERCP procedure demonstrate subtle filling defects within a nondilated common bile duct. Balloon extraction was performed with completion balloon occlusion cholangiogram showing no further filling defects. IMPRESSION: Filling defects in the common bile duct consistent with choledocholithiasis. After balloon sweep extraction, no further filling defects are identified. These images were submitted for radiologic interpretation only. Please see the procedural report for the amount of contrast and the fluoroscopy time utilized. Electronically Signed   By: Irish Lack M.D.   On: 01/30/2017 09:25   Mr Abdomen Mrcp Vivien Rossetti Contast  Result Date: 01/29/2017 CLINICAL DATA:  Abnormal LFTs EXAM: MRI ABDOMEN WITHOUT AND WITH CONTRAST (INCLUDING MRCP) TECHNIQUE: Multiplanar multisequence MR imaging of the abdomen was performed both before and after the administration of intravenous contrast. Heavily T2-weighted images of the biliary and pancreatic ducts were obtained, and three-dimensional MRCP images were rendered by post processing. CONTRAST:  20mL MULTIHANCE GADOBENATE DIMEGLUMINE 529 MG/ML IV SOLN COMPARISON:  Right upper quadrant ultrasound dated 01/28/2017 FINDINGS: Lower chest: Lung bases are clear. Hepatobiliary: Mild hepatic steatosis with areas of focal fatty sparing.  No suspicious/enhancing hepatic lesion is seen. Layering tiny gallstones with gallbladder sludge (series 6/image 20), with associated gallbladder wall thickening. No pericholecystic fluid or inflammatory changes. Fundal adenomyomatosis (series 1502/image 60), benign. No intrahepatic duct dilatation. Common duct measures 7 mm, at the upper limits of  normal. However, three mid/distal CBD stones are suspected (series 7/images 18, 25, and 27), measuring up to 5 mm. Associated filling defect in the distal CBD on coronal MRCP (series 9/ image 35). Pancreas:  Within normal limits. Spleen:  Within normal limits. Adrenals/Urinary Tract: Mild thickening of the left adrenal gland. Right adrenal glands within normal limits. Kidneys are within normal limits.  No hydronephrosis. Stomach/Bowel: Stomach is within normal limits. Visualized bowel is grossly unremarkable. Vascular/Lymphatic:  No evidence of abdominal aortic aneurysm. No suspicious abdominal lymphadenopathy. Other:  No abdominal ascites. Musculoskeletal: No focal osseous lesions. IMPRESSION: Cholelithiasis, without associated findings to suggest acute cholecystitis. Suspected choledocholithiasis with three mid/distal CBD stones measuring up to 5 mm. No intrahepatic or extrahepatic ductal dilatation. Consider ERCP as clinically warranted. Mild hepatic steatosis with areas of focal fatty sparing. Electronically Signed   By: Charline BillsSriyesh  Krishnan M.D.   On: 01/29/2017 07:17      Jerre SimonJessica L Tameko Halder , [facility] 01/30/2017, 10:47 AM Pager: [phone] Consults: [phone] Mon-Fri 7:00 am-4:30 pm Sat-Sun 7:00 am-11:30 am

## 2017-01-30 NOTE — Op Note (Signed)
[facility] Name: Olivia Midd[facility] Procedure Date : 01/30/2017 MRN: [id number] Attending MD: Vida RiggerMarc Faten Frieson , MD Date of Birth: January 11, 1956 CSN: [id number] Age: 61 Admit Type: Inpatient Procedure:                ERCP Indications:              Bile duct stone(s) Providers:                Vida RiggerMarc Owenn Rothermel, MD, Harold BarbanLiz Honeycutt, RN, Beryle BeamsJanie Billups,                            Technician, Kathi DerParag Brahmbhatt, MD Referring MD:              Medicines:                Sedation Administered by an Anesthesia Professional Complications:            No immediate complications. Estimated Blood Loss:     Estimated blood loss was minimal. Procedure:                Pre-Anesthesia Assessment:                           - Prior to the procedure, a History and Physical                            was performed, and patient medications and                            allergies were reviewed. The patient's tolerance of                            previous anesthesia was also reviewed. The risks                            and benefits of the procedure and the sedation                            options and risks were discussed with the patient.                            All questions were answered, and informed consent                            was obtained. Prior Anticoagulants: The patient has                            taken no previous anticoagulant or antiplatelet                            agents. ASA Grade Assessment: II - A patient with                            mild systemic disease. After reviewing the risks  and benefits, the patient was deemed in                            satisfactory condition to undergo the procedure.                           After obtaining informed consent, the scope was                            passed under direct vision. Throughout the                            procedure, the patient's blood pressure, pulse, and   oxygen saturations were monitored continuously. The                            [id number] scope was introduced through the                            mouth, and used to inject contrast into and used to                            inject contrast into the bile duct. The ERCP was                            accomplished without difficulty. The patient                            tolerated the procedure well. Scope In: Scope Out: Findings:      The scout film was normal. The esophagus was successfully intubated       under direct vision. The scope was advanced to a normal major papilla in       the descending duodenum without detailed examination of the pharynx,       larynx and associated structures, and upper GI tract. The upper GI tract       was grossly normal. The major papilla was normal. Deep bile duct       cannulation was obtained. The common bile duct was mildly dilated. The       common bile duct contained filling defect(s) thought to be a stone and       sludge. Biliary sphincterotomy was made with a braided traction       (standard) sphincterotome using ERBE electrocautery. There was no       post-sphincterotomy bleeding. The biliary tree was swept with a 9-12 mm       balloon starting at the upper third of the main bile duct. multiple       balloon sweeps were performed. Sludge was swept from the duct. Two       stones were removed. No stones remained. occlusive cholangiogram showed       no further filling defects. Cystic duct was patent. Pancreatic duct was       not cannulated. Impression:               - The major papilla appeared normal.                           -  A filling defect consistent with a stone and                            sludge was seen on the cholangiogram.                           - The common bile duct was mildly dilated.                           - Choledocholithiasis was found. Complete removal                            was accomplished by  biliary sphincterotomy and                            balloon extraction.                           - A biliary sphincterotomy was performed.                           - The biliary tree was swept. Recommendation:           - Patient has a contact number available for                            emergencies. The signs and symptoms of potential                            delayed complications were discussed with the                            patient. Return to normal activities tomorrow.                            Written discharge instructions were provided to the                            patient.                           - Clear liquid diet for 6 hours. keep patient                            nothing by mouth past midnight if surgery is                            planning for cholecystectomy tomorrow                           - Continue present medications.                           - Return to GI clinic PRN.                           -  Telephone GI clinic if symptomatic PRN.                           - Check liver enzymes (AST, ALT, alkaline                            phosphatase, bilirubin) in the morning. Procedure Code(s):        --- Professional ---                           [phone], Endoscopic retrograde                            cholangiopancreatography (ERCP); with removal of                            calculi/debris from biliary/pancreatic duct(s)                           43262, Endoscopic retrograde                            cholangiopancreatography (ERCP); with                            sphincterotomy/papillotomy Diagnosis Code(s):        --- Professional ---                           K80.50, Calculus of bile duct without cholangitis                            or cholecystitis without obstruction                           K83.8, Other specified diseases of biliary tract                           R93.2, Abnormal findings on diagnostic imaging of                             liver and biliary tract CPT copyright 2016 [employer]. All rights reserved. The codes documented in this report are preliminary and upon coder review may  be revised to meet current compliance requirements. Vida Rigger, MD 01/30/2017 8:21:28 AM This report has been signed electronically. Kathi Der, MD Number of Addenda: 0

## 2017-01-30 NOTE — Anesthesia Postprocedure Evaluation (Signed)
Anesthesia Post Note  Patient: Eliezer MccoyDebra Baris  Procedure(s) Performed: ENDOSCOPIC RETROGRADE CHOLANGIOPANCREATOGRAPHY (ERCP) (N/A )     Patient location during evaluation: PACU Anesthesia Type: General Level of consciousness: awake and alert Pain management: pain level controlled Vital Signs Assessment: post-procedure vital signs reviewed and stable Respiratory status: spontaneous breathing, nonlabored ventilation, respiratory function stable and patient connected to nasal cannula oxygen Cardiovascular status: blood pressure returned to baseline and stable Postop Assessment: no apparent nausea or vomiting Anesthetic complications: no    Last Vitals:  Vitals:   01/30/17 0841 01/30/17 0925  BP: 139/72 (!) 160/74  Pulse: 68 67  Resp: 14 18  Temp: 36.7 C 36.8 C  SpO2: 95% 96%    Last Pain:  Vitals:   01/30/17 0925  TempSrc: Oral  PainSc:                  Shelton SilvasKevin D Laquentin Loudermilk

## 2017-01-30 NOTE — Anesthesia Procedure Notes (Signed)
Procedure Name: Intubation Date/Time: 01/30/2017 7:37 AM Performed by: Barrington Ellison, CRNA Pre-anesthesia Checklist: Patient identified, Emergency Drugs available, Suction available and Patient being monitored Patient Re-evaluated:Patient Re-evaluated prior to induction Oxygen Delivery Method: Circle System Utilized Preoxygenation: Pre-oxygenation with 100% oxygen Induction Type: IV induction Ventilation: Mask ventilation without difficulty Laryngoscope Size: Mac and 3 Grade View: Grade III Tube type: Oral Tube size: 7.0 mm Number of attempts: 1 Airway Equipment and Method: Stylet and Oral airway Placement Confirmation: ETT inserted through vocal cords under direct vision,  positive ETCO2 and breath sounds checked- equal and bilateral Secured at: 22 cm Tube secured with: Tape Dental Injury: Teeth and Oropharynx as per pre-operative assessment

## 2017-01-30 NOTE — Brief Op Note (Signed)
01/28/2017 - 01/30/2017  8:23 AM  PATIENT:  Eliezer Mccoyebra Langdon  61 y.o. female  PRE-OPERATIVE DIAGNOSIS:  abdominal pain, elevated LFTs, bile duct stones on MRCP  POST-OPERATIVE DIAGNOSIS:  spincterotomy and stone removal  PROCEDURE:  Procedure(s): ENDOSCOPIC RETROGRADE CHOLANGIOPANCREATOGRAPHY (ERCP) (N/A)  SURGEON:  Surgeon(s) and Role:    * Vida RiggerMagod, Marc, MD - Primary    * Cleopatra Sardo, MD - Assisting  Findings \ recommendations -------------------------------------- - ERCP today showed small filling defects in the CBD. Balloon sweep showed 2 tiny stones. - Patient tolerated procedure well. Patent cystic duct was noted. Pancreatic duct was not injected - Clear liquid diet. Keep nothing by mouth past midnight if surgery is planning for cholecystectomy tomorrow - Repeat CBC and CMP in the morning. - GI Will follow  Kathi DerParag Jamese Trauger MD, FACP 01/30/2017, 8:29 AM  Contact #  [phone]

## 2017-01-30 NOTE — Progress Notes (Signed)
[facility]  Olivia MccoyDebra Middleton 61 y.o. 11/26/1955  CC:  Choledocholithiasis   Subjective: No acute issues overnight. Remains afebrile. Abdominal pain improving.  ROS : Negative for chest pain shortness of breath. Negative for palpitations   Objective: Vital signs in last 24 hours: Vitals:   01/30/17 0426 01/30/17 0700  BP: (!) 144/72 (!) 178/83  Pulse: 79 72  Resp: 16 14  Temp: 98.4 F (36.9 C) 98.6 F (37 C)  SpO2: 97% 96%    Physical Exam:  General:  Alert, cooperative, no distress, appears stated age  Head:  Normocephalic, without obvious abnormality, atraumatic  Eyes:  , EOM's intact, mild icterus noted   Lungs:   Clear to auscultation bilaterally, respirations unlabored  Heart:  Regular rate and rhythm, S1, S2 normal  Abdomen:   Soft, non-tender, bowel sounds active all four quadrants,  no masses,   Extremities: Extremities normal, atraumatic, no  edema       Lab Results: Recent Labs    01/29/17 0345 01/30/17 0432  NA 136 139  K 3.6 3.6  CL 104 108  CO2 24 23  GLUCOSE 150* 123*  BUN 14 8  CREATININE 1.02* 0.85  CALCIUM 8.3* 8.4*  MG  --  1.6*  PHOS  --  2.8   Recent Labs    01/29/17 0345 01/30/17 0432  AST 121* 91*  ALT 171* 146*  ALKPHOS 103 110  BILITOT 7.9* 5.3*  PROT 6.2* 6.3*  ALBUMIN 2.8* 2.8*   Recent Labs    01/29/17 0345 01/30/17 0432  WBC 9.3 6.8  NEUTROABS  --  3.1  HGB 13.6 13.6  HCT 41.4 41.0  MCV 89.2 89.1  PLT 218 224   No results for input(s): LABPROT, INR in the last 72 hours.    Assessment/Plan: - Choledocholithiasis. - Abnormal LFTs. Improving  Recommendations -------------------------- - Proceed with ERCP today. Risks benefits and alternatives discussed with the patient and family. Risks including but not limited to infection, bleeding, perforation and post-ERCP pancreatitis. Patient verbalized understanding. - Patient is currently on Zosyn.   Kathi DerParag Kamille Toomey MD, FACP 01/30/2017,  7:13 AM  Contact #  [phone]

## 2017-01-30 NOTE — Progress Notes (Signed)
Pt seen examined and agree with APP note [facility] Sunday

## 2017-01-30 NOTE — Transfer of Care (Signed)
Immediate Anesthesia Transfer of Care Note  Patient: Olivia MccoyDebra Middleton  Procedure(s) Performed: ENDOSCOPIC RETROGRADE CHOLANGIOPANCREATOGRAPHY (ERCP) (N/A )  Patient Location: PACU  Anesthesia Type:General  Level of Consciousness: awake, alert  and oriented  Airway & Oxygen Therapy: Patient Spontanous Breathing  Post-op Assessment: Report given to RN  Post vital signs: Reviewed and stable  Last Vitals:  Vitals:   01/30/17 0426 01/30/17 0700  BP: (!) 144/72 (!) 178/83  Pulse: 79 72  Resp: 16 14  Temp: 36.9 C 37 C  SpO2: 97% 96%    Last Pain:  Vitals:   01/30/17 0700  TempSrc: Oral  PainSc:          Complications: No apparent anesthesia complications

## 2017-01-30 NOTE — Progress Notes (Signed)
PROGRESS NOTE    Olivia Middleton  JXB:[id number] DOB: [birth year] DOA: 01/28/2017 PCP: Maurice Small, MD   Brief Narrative:  Olivia Middleton is a 61 y.o. female with medical history significant for Hepatic Steatosis, Diabetes on metformin, Hypertension, and Obesity.  Patient reported that yesterday she had mild right upper quadrant abdominal pain that she felt was related to gas.  She took a Gas-X pill and felt much better after laying down and resting.  She was quite nauseated at the same time.Yesterday morning she was having no symptoms. She ate breakfast and had no symptoms immediately following eating.  She went to work as usual and after being at work about an hour she developed severe right upper quadrant abdominal pain that worsened with deep breath and was quite nauseous.  She went to have a bowel movement and began having diarrhea that was not typical for her baseline.  Because of the severity of symptoms she presented to the ER.  She was worked up for her RUQ pain and found to have Choledocholithiasis and Gastroenterology and General Surgery were consulted for further evaluation and recommendations. Patient to undergo ERCP in AM and likely Lap Chole later this admission.   01/30/17 - Patient seen alongside patient's Husband. Patient underwent ERCP today, please ERCP note. For cholecystectomy in am. No new complaints.  Assessment & Plan:   Principal Problem:   Choledocholithiasis Active Problems:   RUQ abdominal pain   Diabetes mellitus   Hypertension   SIRS (systemic inflammatory response syndrome) (HCC)   Abnormal urinalysis   Hepatic steatosis  RUQ abdominal pain with Choledocholithiasis - For Surgery in am. -Patient presented with abrupt onset of abdominal pain x 2- worst episode today after eating fatty meal with elevation in total bilirubin and ALT/AST concerning for choledocholithiasis -Patient's body habitus and bowel gas interfering with adequate abdominal ultrasound and  therefore undetected cholelithiasis may be present; Ultrasound of the Gallbladder done and showed o definite sonographic findings to suggest cholelithiasis -MRCP done and showed Cholelithiasis, without associated findings to suggest acute cholecystitis. Suspected choledocholithiasis with three mid/distal CBD stones measuring up to 5 mm. No intrahepatic or extrahepatic ductal dilatation. Mild hepatic steatosis with areas of focal fatty sparing. -GI Consulted and recommending ERCP in AM and General Surgery Consult for Laparoscopic Cholecystectomy   -Clear Liquid Diet for now and NPO at Midnight for ERCP -C/w Acetaminophen 650 mg po q6hprn mild pain/fever and IV Morphine 1-4 mg q2hprn for pain -IV Zofran 4 mg q6hprn for Nausea -IV Zosyn for SIRS physiology/possible early Ascending Cholangitis -Blood Cultures x2 obtained and pending  -Hepatitis panel Negative -General Surgery Consulted and liklely will proceed with Lap Chole after ERCP; Will defer timing to General Surgery of when this will take place  SIRS (Systemic Inflammatory Response Syndrome) 2/2 to suspected Obstructive Choledocholithiasis  -Patient presents with acute abdominal pain with significant leukocytosis and elevated lactic acid with source of infection suspected to be gallbladder -Cycled lactic acid and trended down -WBC went from 16.5 -> 9.3 -C/w IVFluid Resuscitation with NS at 150 mL/hr -Empiric Antibiotics with IV Zosyn to cover gram-negative organisms -Follow urine output and vital signs closely-currently did not meet criteria for sepsis  Lactic Acidosis  -Patient's Lactic Acid Level improved from 2.44 -> 1.6 -C/w IVF Rehydration with NS at 150 mL/hr  Abnormal LFT's/Transaminitis  -Likely 2/2 Above -AST went from 195 -> 121 -ALT went from 206 -> 171 -Likely 2/2 to Obstructive Choledocholithiasis  -Repeat CMP in AM -Patient to undergo ERCP in  AM  Hyperbilirubinemia -Patient's T Bili went from 6.7 -> 7.9 -Likely  from Obstructive Choledocholithiasis   Diabetes Mellitus -Hold Home metformin acutely -C/w Sensitive Novolog SSI q4h -HgbA1c done and was 6.5 -CBG's ranging from 127-160  Essential Hypertension -Current blood pressure suboptimal in setting of gram-negative SIRS/? Evolving sepsis therefore holding home antihypertensive medications of Amlodipine 5 mg po Daily and Lisinopril 5 mg po Daily -Continue to Monitor   Hepatic Steatosis -Ultrasound of the Liver showed Diffusely increased echogenicity suggests steatosis. Portal vein is patent on color Doppler imaging with normal direction of blood flow towards the liver. -MRCP showed mild hepatic steatosis with areas of focal fatty sparing. -At baseline transaminases have been normal -Repeat CMP in AM  -Encourage Weight Loss and Diet Counseling   Abnormal Urinalysis -Urinalysis showed Hazy Appearance, Moderate Bilirubin, Small Hgb, Rare Bacteria, 6-30 WBC's -Obtain Urine Culture and pending -Empiric antibiotics as above  Mildly Elevated Cr -Patient's Cr worsened slightly 0.98 -> 1.02 -Unclear Baseline -C/w IVF as above  DVT prophylaxis: Enoxaparin 40 mg sq q24h Code Status: FULL CODE Family Communication: Discussed with Family present at bedside Disposition Plan: Remain Inpatient for ERCP and likely Lap Chole this Admission   Consultants:   Gastroenterology   General Surgery   Procedures: MRCP   Antimicrobials:  Anti-infectives (From admission, onward)   Start     Dose/Rate Route Frequency Ordered Stop   01/28/17 2000  piperacillin-tazobactam (ZOSYN) IVPB 3.375 g     3.375 g 12.5 mL/hr over 240 Minutes Intravenous Every 8 hours 01/28/17 1510     01/28/17 1230  piperacillin-tazobactam (ZOSYN) IVPB 3.375 g     3.375 g 100 mL/hr over 30 Minutes Intravenous  Once 01/28/17 1226 01/28/17 1452     Subjective: Seen and examined at bedside and states abdominal pain was improved but she was still nauseous. No CP or SOB. No other  concerns or complaints at this time.   Objective: Vitals:   01/30/17 0834 01/30/17 0841 01/30/17 0925 01/30/17 1300  BP: 139/72 139/72 (!) 160/74 (!) 158/75  Pulse: 66 68 67 70  Resp: 15 14 18 17   Temp:  98 F (36.7 C) 98.2 F (36.8 C) 98.8 F (37.1 C)  TempSrc:   Oral Oral  SpO2: 99% 95% 96% 96%  Weight:      Height:        Intake/Output Summary (Last 24 hours) at 01/30/2017 1320 Last data filed at 01/30/2017 1610 Gross per 24 hour  Intake 1652.5 ml  Output 1100 ml  Net 552.5 ml   Filed Weights   01/29/17 0844 01/30/17 0426 01/30/17 0700  Weight: (!) 140.1 kg (308 lb 13.8 oz) (!) 139.6 kg (307 lb 12.2 oz) (!) 139.6 kg (307 lb 12.2 oz)   Examination: Physical Exam:  Constitutional: WN/WD obese jaundiced appearing Caucasian female in NAD and appears calm  Eyes: Lids and conjunctivae normal, sclerae icteric  ENMT: External Ears, Nose appear normal. Grossly normal hearing. Mucous membranes are moist. Neck: Appears normal, supple, no cervical masses, normal ROM, no appreciable thyromegaly; no JVD Respiratory: Diminished to auscultation bilaterally, no wheezing, rales, rhonchi or crackles. Normal respiratory effort and patient is not tachypenic. No accessory muscle use.  Cardiovascular: RRR, no murmurs / rubs / gallops. S1 and S2 auscultated. No extremity edema.  Abdomen: Soft, Tender to palpate in RUQ and Mid Epigastrum, Distended 2/2 body habitus. No masses palpated. No appreciable hepatosplenomegaly. Bowel sounds positive x4.  GU: Deferred. Musculoskeletal: No clubbing / cyanosis of digits/nails. No  joint deformity upper and lower extremities.  Skin: No rashes, lesions, ulcers on a limited skin eval. No induration; Warm and dry.  Neurologic: CN 2-12 grossly intact with no focal deficits. Romberg sign cerebellar reflexes not assessed.  Psychiatric: Normal judgment and insight. Alert and oriented x 3. Pleasant and Normal mood and appropriate affect.   Data Reviewed: I have  personally reviewed following labs and imaging studies  CBC: Recent Labs  Lab 01/28/17 1113 01/29/17 0345 01/30/17 0432  WBC 16.5* 9.3 6.8  NEUTROABS  --   --  3.1  HGB 16.2* 13.6 13.6  HCT 47.6* 41.4 41.0  MCV 88.5 89.2 89.1  PLT 258 218 224   Basic Metabolic Panel: Recent Labs  Lab 01/28/17 1113 01/29/17 0345 01/30/17 0432  NA 138 136 139  K 3.8 3.6 3.6  CL 104 104 108  CO2 24 24 23   GLUCOSE 139* 150* 123*  BUN 13 14 8   CREATININE 0.98 1.02* 0.85  CALCIUM 9.2 8.3* 8.4*  MG  --   --  1.6*  PHOS  --   --  2.8   GFR: Estimated Creatinine Clearance: 103.4 mL/min (by C-G formula based on SCr of 0.85 mg/dL). Liver Function Tests: Recent Labs  Lab 01/28/17 1113 01/29/17 0345 01/30/17 0432  AST 195* 121* 91*  ALT 206* 171* 146*  ALKPHOS 118 103 110  BILITOT 6.7* 7.9* 5.3*  PROT 7.5 6.2* 6.3*  ALBUMIN 3.5 2.8* 2.8*   Recent Labs  Lab 01/28/17 1113  LIPASE 31   No results for input(s): AMMONIA in the last 168 hours. Coagulation Profile: No results for input(s): INR, PROTIME in the last 168 hours. Cardiac Enzymes: No results for input(s): CKTOTAL, CKMB, CKMBINDEX, TROPONINI in the last 168 hours. BNP (last 3 results) No results for input(s): PROBNP in the last 8760 hours. HbA1C: Recent Labs    01/28/17 1621  HGBA1C 6.5*   CBG: Recent Labs  Lab 01/29/17 1707 01/29/17 2050 01/29/17 2358 01/30/17 0433 01/30/17 1150  GLUCAP 110* 146* 113* 124* 171*   Lipid Profile: No results for input(s): CHOL, HDL, LDLCALC, TRIG, CHOLHDL, LDLDIRECT in the last 72 hours. Thyroid Function Tests: No results for input(s): TSH, T4TOTAL, FREET4, T3FREE, THYROIDAB in the last 72 hours. Anemia Panel: No results for input(s): VITAMINB12, FOLATE, FERRITIN, TIBC, IRON, RETICCTPCT in the last 72 hours. Sepsis Labs: Recent Labs  Lab 01/28/17 1152 01/28/17 1419 01/28/17 1631 01/28/17 2017  LATICACIDVEN 2.94* 2.44* 1.4 1.6   Recent Results (from the past 240 hour(s))    Culture, blood (Routine X 2) w Reflex to ID Panel     Status: None (Preliminary result)   Collection Time: 01/28/17  4:09 PM  Result Value Ref Range Status   Specimen Description BLOOD BLOOD RIGHT HAND  Final   Special Requests   Final    BOTTLES DRAWN AEROBIC AND ANAEROBIC Blood Culture adequate volume   Culture NO GROWTH 2 DAYS  Final   Report Status PENDING  Incomplete  Culture, Urine     Status: None   Collection Time: 01/28/17  4:20 PM  Result Value Ref Range Status   Specimen Description URINE, RANDOM  Final   Special Requests NONE  Final   Culture NO GROWTH  Final   Report Status 01/29/2017 FINAL  Final  Culture, blood (Routine X 2) w Reflex to ID Panel     Status: None (Preliminary result)   Collection Time: 01/28/17  4:40 PM  Result Value Ref Range Status   Specimen  Description BLOOD LEFT ANTECUBITAL  Final   Special Requests   Final    BOTTLES DRAWN AEROBIC AND ANAEROBIC Blood Culture results may not be optimal due to an excessive volume of blood received in culture bottles   Culture NO GROWTH 2 DAYS  Final   Report Status PENDING  Incomplete     Radiology Studies: Mr 3d Recon At Scanner  Result Date: 01/29/2017 CLINICAL DATA:  Abnormal LFTs EXAM: MRI ABDOMEN WITHOUT AND WITH CONTRAST (INCLUDING MRCP) TECHNIQUE: Multiplanar multisequence MR imaging of the abdomen was performed both before and after the administration of intravenous contrast. Heavily T2-weighted images of the biliary and pancreatic ducts were obtained, and three-dimensional MRCP images were rendered by post processing. CONTRAST:  20mL MULTIHANCE GADOBENATE DIMEGLUMINE 529 MG/ML IV SOLN COMPARISON:  Right upper quadrant ultrasound dated 01/28/2017 FINDINGS: Lower chest: Lung bases are clear. Hepatobiliary: Mild hepatic steatosis with areas of focal fatty sparing. No suspicious/enhancing hepatic lesion is seen. Layering tiny gallstones with gallbladder sludge (series 6/image 20), with associated gallbladder wall  thickening. No pericholecystic fluid or inflammatory changes. Fundal adenomyomatosis (series 1502/image 60), benign. No intrahepatic duct dilatation. Common duct measures 7 mm, at the upper limits of normal. However, three mid/distal CBD stones are suspected (series 7/images 18, 25, and 27), measuring up to 5 mm. Associated filling defect in the distal CBD on coronal MRCP (series 9/ image 35). Pancreas:  Within normal limits. Spleen:  Within normal limits. Adrenals/Urinary Tract: Mild thickening of the left adrenal gland. Right adrenal glands within normal limits. Kidneys are within normal limits.  No hydronephrosis. Stomach/Bowel: Stomach is within normal limits. Visualized bowel is grossly unremarkable. Vascular/Lymphatic:  No evidence of abdominal aortic aneurysm. No suspicious abdominal lymphadenopathy. Other:  No abdominal ascites. Musculoskeletal: No focal osseous lesions. IMPRESSION: Cholelithiasis, without associated findings to suggest acute cholecystitis. Suspected choledocholithiasis with three mid/distal CBD stones measuring up to 5 mm. No intrahepatic or extrahepatic ductal dilatation. Consider ERCP as clinically warranted. Mild hepatic steatosis with areas of focal fatty sparing. Electronically Signed   By: Charline BillsSriyesh  Krishnan M.D.   On: 01/29/2017 07:17   Dg Ercp With Sphincterotomy  Result Date: 01/30/2017 CLINICAL DATA:  Suggestion of choledocholithiasis by MRCP. EXAM: ERCP TECHNIQUE: Multiple spot images obtained with the fluoroscopic device and submitted for interpretation post-procedure. COMPARISON:  MRCP study on 01/29/2017 FINDINGS: Images submitted during the ERCP procedure demonstrate subtle filling defects within a nondilated common bile duct. Balloon extraction was performed with completion balloon occlusion cholangiogram showing no further filling defects. IMPRESSION: Filling defects in the common bile duct consistent with choledocholithiasis. After balloon sweep extraction, no further  filling defects are identified. These images were submitted for radiologic interpretation only. Please see the procedural report for the amount of contrast and the fluoroscopy time utilized. Electronically Signed   By: Irish LackGlenn  Yamagata M.D.   On: 01/30/2017 09:25   Mr Abdomen Mrcp Vivien RossettiW Wo Contast  Result Date: 01/29/2017 CLINICAL DATA:  Abnormal LFTs EXAM: MRI ABDOMEN WITHOUT AND WITH CONTRAST (INCLUDING MRCP) TECHNIQUE: Multiplanar multisequence MR imaging of the abdomen was performed both before and after the administration of intravenous contrast. Heavily T2-weighted images of the biliary and pancreatic ducts were obtained, and three-dimensional MRCP images were rendered by post processing. CONTRAST:  20mL MULTIHANCE GADOBENATE DIMEGLUMINE 529 MG/ML IV SOLN COMPARISON:  Right upper quadrant ultrasound dated 01/28/2017 FINDINGS: Lower chest: Lung bases are clear. Hepatobiliary: Mild hepatic steatosis with areas of focal fatty sparing. No suspicious/enhancing hepatic lesion is seen. Layering tiny gallstones with gallbladder  sludge (series 6/image 20), with associated gallbladder wall thickening. No pericholecystic fluid or inflammatory changes. Fundal adenomyomatosis (series 1502/image 60), benign. No intrahepatic duct dilatation. Common duct measures 7 mm, at the upper limits of normal. However, three mid/distal CBD stones are suspected (series 7/images 18, 25, and 27), measuring up to 5 mm. Associated filling defect in the distal CBD on coronal MRCP (series 9/ image 35). Pancreas:  Within normal limits. Spleen:  Within normal limits. Adrenals/Urinary Tract: Mild thickening of the left adrenal gland. Right adrenal glands within normal limits. Kidneys are within normal limits.  No hydronephrosis. Stomach/Bowel: Stomach is within normal limits. Visualized bowel is grossly unremarkable. Vascular/Lymphatic:  No evidence of abdominal aortic aneurysm. No suspicious abdominal lymphadenopathy. Other:  No abdominal  ascites. Musculoskeletal: No focal osseous lesions. IMPRESSION: Cholelithiasis, without associated findings to suggest acute cholecystitis. Suspected choledocholithiasis with three mid/distal CBD stones measuring up to 5 mm. No intrahepatic or extrahepatic ductal dilatation. Consider ERCP as clinically warranted. Mild hepatic steatosis with areas of focal fatty sparing. Electronically Signed   By: Charline Bills M.D.   On: 01/29/2017 07:17   Scheduled Meds: . chlorhexidine  15 mL Mouth Rinse BID  . enoxaparin (LOVENOX) injection  40 mg Subcutaneous Q24H  . insulin aspart  0-9 Units Subcutaneous Q4H  . mouth rinse  15 mL Mouth Rinse q12n4p  . sodium chloride flush  3 mL Intravenous Q12H   Continuous Infusions: . sodium chloride 150 mL/hr at 01/30/17 0426  . piperacillin-tazobactam (ZOSYN)  IV 3.375 g (01/30/17 1231)    LOS: 2 days   Barnetta Chapel, MD [facility] Pager [phone].  If 7PM-7AM, please contact night-coverage [email or web address] Password [email or web address] 01/30/2017, 1:20 PM

## 2017-01-30 NOTE — Progress Notes (Signed)
MRSA PCR negative.  

## 2017-01-30 NOTE — Progress Notes (Signed)
PCR surgical screen sent.

## 2017-01-31 ENCOUNTER — Inpatient Hospital Stay (HOSPITAL_COMMUNITY): Payer: BC Managed Care – PPO | Admitting: Certified Registered"

## 2017-01-31 ENCOUNTER — Encounter (HOSPITAL_COMMUNITY): Payer: Self-pay | Admitting: Surgery

## 2017-01-31 ENCOUNTER — Encounter (HOSPITAL_COMMUNITY)
Admission: EM | Disposition: A | Discharge status: Home / Self Care | Payer: Self-pay | Source: Home / Self Care | Attending: Internal Medicine

## 2017-01-31 HISTORY — PX: CHOLECYSTECTOMY: SHX55

## 2017-01-31 LAB — COMPREHENSIVE METABOLIC PANEL
ALK PHOS: 105 U/L (ref 38–126)
ALT: 143 U/L — AB (ref 14–54)
ANION GAP: 9 (ref 5–15)
AST: 87 U/L — ABNORMAL HIGH (ref 15–41)
Albumin: 2.9 g/dL — ABNORMAL LOW (ref 3.5–5.0)
BILIRUBIN TOTAL: 3.1 mg/dL — AB (ref 0.3–1.2)
BUN: 6 mg/dL (ref 6–20)
CALCIUM: 8.5 mg/dL — AB (ref 8.9–10.3)
CO2: 23 mmol/L (ref 22–32)
CREATININE: 0.85 mg/dL (ref 0.44–1.00)
Chloride: 106 mmol/L (ref 101–111)
Glucose, Bld: 135 mg/dL — ABNORMAL HIGH (ref 65–99)
Potassium: 3.6 mmol/L (ref 3.5–5.1)
Sodium: 138 mmol/L (ref 135–145)
TOTAL PROTEIN: 6.3 g/dL — AB (ref 6.5–8.1)

## 2017-01-31 LAB — GLUCOSE, CAPILLARY
Glucose-Capillary: 118 mg/dL — ABNORMAL HIGH (ref 65–99)
Glucose-Capillary: 125 mg/dL — ABNORMAL HIGH (ref 65–99)
Glucose-Capillary: 139 mg/dL — ABNORMAL HIGH (ref 65–99)
Glucose-Capillary: 149 mg/dL — ABNORMAL HIGH (ref 65–99)
Glucose-Capillary: 149 mg/dL — ABNORMAL HIGH (ref 65–99)
Glucose-Capillary: 158 mg/dL — ABNORMAL HIGH (ref 65–99)
Glucose-Capillary: 204 mg/dL — ABNORMAL HIGH (ref 65–99)

## 2017-01-31 LAB — CBC
HEMATOCRIT: 41.7 % (ref 36.0–46.0)
HEMOGLOBIN: 13.7 g/dL (ref 12.0–15.0)
MCH: 29.3 pg (ref 26.0–34.0)
MCHC: 32.9 g/dL (ref 30.0–36.0)
MCV: 89.1 fL (ref 78.0–100.0)
Platelets: 252 10*3/uL (ref 150–400)
RBC: 4.68 MIL/uL (ref 3.87–5.11)
RDW: 13.8 % (ref 11.5–15.5)
WBC: 9.7 10*3/uL (ref 4.0–10.5)

## 2017-01-31 SURGERY — LAPAROSCOPIC CHOLECYSTECTOMY
Anesthesia: General | Site: Abdomen

## 2017-01-31 MED ORDER — PROMETHAZINE HCL 25 MG/ML IJ SOLN: 6.2500 mg | INTRAMUSCULAR | Status: DC | PRN

## 2017-01-31 MED ORDER — FENTANYL CITRATE (PF) 100 MCG/2ML IJ SOLN
25.0000 ug | INTRAMUSCULAR | Status: DC | PRN
  Administered 2017-01-31 (×2): 50 ug via INTRAVENOUS

## 2017-01-31 MED ORDER — PHENYLEPHRINE HCL 10 MG/ML IJ SOLN
INTRAMUSCULAR | Status: DC | PRN
  Administered 2017-01-31: 80 ug via INTRAVENOUS

## 2017-01-31 MED ORDER — OXYCODONE HCL 5 MG PO TABS
10.0000 mg | ORAL_TABLET | ORAL | Status: DC | PRN
  Administered 2017-01-31 (×2): 10 mg via ORAL
  Filled 2017-01-31 (×2): qty 2

## 2017-01-31 MED ORDER — ONDANSETRON HCL 4 MG/2ML IJ SOLN
INTRAMUSCULAR | Status: DC | PRN
  Administered 2017-01-31: 4 mg via INTRAVENOUS

## 2017-01-31 MED ORDER — LIDOCAINE HCL (CARDIAC) 20 MG/ML IV SOLN
INTRAVENOUS | Status: DC | PRN
  Administered 2017-01-31: 100 mg via INTRAVENOUS
  Administered 2017-01-31: 50 mg via INTRAVENOUS

## 2017-01-31 MED ORDER — ROCURONIUM BROMIDE 100 MG/10ML IV SOLN
INTRAVENOUS | Status: DC | PRN
  Administered 2017-01-31: 50 mg via INTRAVENOUS

## 2017-01-31 MED ORDER — BUPIVACAINE-EPINEPHRINE (PF) 0.25% -1:200000 IJ SOLN
INTRAMUSCULAR | Status: AC
  Filled 2017-01-31: qty 30

## 2017-01-31 MED ORDER — LACTATED RINGERS IV SOLN
INTRAVENOUS | Status: DC
  Administered 2017-01-31 (×2): via INTRAVENOUS

## 2017-01-31 MED ORDER — PROPOFOL 10 MG/ML IV BOLUS
INTRAVENOUS | Status: DC | PRN
  Administered 2017-01-31: 200 mg via INTRAVENOUS

## 2017-01-31 MED ORDER — FENTANYL CITRATE (PF) 250 MCG/5ML IJ SOLN
INTRAMUSCULAR | Status: AC
Start: 2017-01-31 — End: 2017-01-31
  Filled 2017-01-31: qty 5

## 2017-01-31 MED ORDER — SODIUM CHLORIDE 0.9 % IR SOLN
Status: DC | PRN
  Administered 2017-01-31 (×2): 1000 mL

## 2017-01-31 MED ORDER — FENTANYL CITRATE (PF) 100 MCG/2ML IJ SOLN
INTRAMUSCULAR | Status: DC | PRN
  Administered 2017-01-31: 100 ug via INTRAVENOUS
  Administered 2017-01-31 (×2): 50 ug via INTRAVENOUS

## 2017-01-31 MED ORDER — DEXAMETHASONE SODIUM PHOSPHATE 4 MG/ML IJ SOLN
INTRAMUSCULAR | Status: DC | PRN
Start: 2017-01-31 — End: 2017-01-31
  Administered 2017-01-31: 5 mg via INTRAVENOUS

## 2017-01-31 MED ORDER — MIDAZOLAM HCL 2 MG/2ML IJ SOLN
INTRAMUSCULAR | Status: AC
  Filled 2017-01-31: qty 2

## 2017-01-31 MED ORDER — BUPIVACAINE-EPINEPHRINE 0.25% -1:200000 IJ SOLN
INTRAMUSCULAR | Status: DC | PRN
  Administered 2017-01-31: 9 mL

## 2017-01-31 MED ORDER — SUGAMMADEX SODIUM 200 MG/2ML IV SOLN
INTRAVENOUS | Status: DC | PRN
  Administered 2017-01-31: 279.2 mg via INTRAVENOUS

## 2017-01-31 MED ORDER — PROPOFOL 10 MG/ML IV BOLUS
INTRAVENOUS | Status: AC
  Filled 2017-01-31: qty 20

## 2017-01-31 MED ORDER — SODIUM CHLORIDE 0.9 % IV SOLN
INTRAVENOUS | Status: DC
  Administered 2017-01-31: 13:00:00 via INTRAVENOUS
  Administered 2017-02-01: 1 mL via INTRAVENOUS

## 2017-01-31 MED ORDER — 0.9 % SODIUM CHLORIDE (POUR BTL) OPTIME
TOPICAL | Status: DC | PRN
  Administered 2017-01-31: 1000 mL

## 2017-01-31 MED ORDER — MIDAZOLAM HCL 5 MG/5ML IJ SOLN
INTRAMUSCULAR | Status: DC | PRN
Start: 2017-01-31 — End: 2017-01-31
  Administered 2017-01-31: 2 mg via INTRAVENOUS

## 2017-01-31 MED ORDER — FENTANYL CITRATE (PF) 100 MCG/2ML IJ SOLN
INTRAMUSCULAR | Status: AC
  Administered 2017-01-31: 50 ug via INTRAVENOUS
  Filled 2017-01-31: qty 2

## 2017-01-31 SURGICAL SUPPLY — 40 items
ADH SKN CLS APL DERMABOND .7 (GAUZE/BANDAGES/DRESSINGS) ×1
APPLIER CLIP 5 13 M/L LIGAMAX5 (MISCELLANEOUS) ×3
APR CLP MED LRG 5 ANG JAW (MISCELLANEOUS) ×1
BAG SPEC RTRVL 10 TROC 200 (ENDOMECHANICALS) ×1
BLADE CLIPPER SURG (BLADE) IMPLANT
CANISTER SUCT 3000ML PPV (MISCELLANEOUS) ×3 IMPLANT
CHLORAPREP W/TINT 26ML (MISCELLANEOUS) ×3 IMPLANT
CLIP APPLIE 5 13 M/L LIGAMAX5 (MISCELLANEOUS) ×1 IMPLANT
CLOSURE WOUND 1/2 X4 (GAUZE/BANDAGES/DRESSINGS) ×1
COVER SURGICAL LIGHT HANDLE (MISCELLANEOUS) ×3 IMPLANT
DERMABOND ADVANCED (GAUZE/BANDAGES/DRESSINGS) ×2
DERMABOND ADVANCED .7 DNX12 (GAUZE/BANDAGES/DRESSINGS) ×1 IMPLANT
DEVICE TROCAR PUNCTURE CLOSURE (ENDOMECHANICALS) ×3 IMPLANT
ELECT REM PT RETURN 9FT ADLT (ELECTROSURGICAL) ×3
ELECTRODE REM PT RTRN 9FT ADLT (ELECTROSURGICAL) ×1 IMPLANT
GLOVE BIO SURGEON STRL SZ7 (GLOVE) ×3 IMPLANT
GLOVE BIOGEL PI IND STRL 7.5 (GLOVE) ×1 IMPLANT
GLOVE BIOGEL PI INDICATOR 7.5 (GLOVE) ×2
GOWN STRL REUS W/ TWL LRG LVL3 (GOWN DISPOSABLE) ×3 IMPLANT
GOWN STRL REUS W/TWL LRG LVL3 (GOWN DISPOSABLE) ×9
HEMOSTAT SNOW SURGICEL 2X4 (HEMOSTASIS) ×2 IMPLANT
KIT BASIN OR (CUSTOM PROCEDURE TRAY) ×3 IMPLANT
KIT ROOM TURNOVER OR (KITS) ×3 IMPLANT
NS IRRIG 1000ML POUR BTL (IV SOLUTION) ×3 IMPLANT
PAD ARMBOARD 7.5X6 YLW CONV (MISCELLANEOUS) ×3 IMPLANT
POUCH RETRIEVAL ECOSAC 10 (ENDOMECHANICALS) ×1 IMPLANT
POUCH RETRIEVAL ECOSAC 10MM (ENDOMECHANICALS) ×2
SCISSORS LAP 5X35 DISP (ENDOMECHANICALS) ×3 IMPLANT
SET IRRIG TUBING LAPAROSCOPIC (IRRIGATION / IRRIGATOR) ×3 IMPLANT
SLEEVE ENDOPATH XCEL 5M (ENDOMECHANICALS) ×6 IMPLANT
SPECIMEN JAR SMALL (MISCELLANEOUS) ×3 IMPLANT
STRIP CLOSURE SKIN 1/2X4 (GAUZE/BANDAGES/DRESSINGS) ×2 IMPLANT
SUT MNCRL AB 4-0 PS2 18 (SUTURE) ×3 IMPLANT
SUT VICRYL 0 UR6 27IN ABS (SUTURE) ×3 IMPLANT
TOWEL OR 17X24 6PK STRL BLUE (TOWEL DISPOSABLE) ×3 IMPLANT
TOWEL OR 17X26 10 PK STRL BLUE (TOWEL DISPOSABLE) ×3 IMPLANT
TRAY LAPAROSCOPIC MC (CUSTOM PROCEDURE TRAY) ×3 IMPLANT
TROCAR XCEL BLUNT TIP 100MML (ENDOMECHANICALS) ×3 IMPLANT
TROCAR XCEL NON-BLD 5MMX100MML (ENDOMECHANICALS) ×3 IMPLANT
TUBING INSUFFLATION (TUBING) ×3 IMPLANT

## 2017-01-31 NOTE — Transfer of Care (Signed)
Immediate Anesthesia Transfer of Care Note  Patient: Olivia MccoyDebra Middleton  Procedure(s) Performed: LAPAROSCOPIC CHOLECYSTECTOMY (N/A Abdomen)  Patient Location: PACU  Anesthesia Type:General  Level of Consciousness: awake, alert  and oriented  Airway & Oxygen Therapy: Patient Spontanous Breathing and Patient connected to nasal cannula oxygen  Post-op Assessment: Report given to RN and Post -op Vital signs reviewed and stable  Post vital signs: Reviewed and stable  Last Vitals:  Vitals:   01/31/17 0243 01/31/17 0639  BP: 128/72 (!) 108/45  Pulse: 63 66  Resp: 17 17  Temp: 36.8 C 36.4 C  SpO2: 92% 96%    Last Pain:  Vitals:   01/30/17 2244  TempSrc: Oral  PainSc:          Complications: No apparent anesthesia complications

## 2017-01-31 NOTE — Progress Notes (Signed)
[facility]  Eliezer MccoyDebra Stubblefield 61 y.o. September 17, 1955  CC:  Choledocholithiasis   Subjective:  Patient is S/P lap chole today. C/O mild abdominal discomfort. C/O nausea, denied vomiting.   ROS : Negative for chest pain shortness of breath. Negative for palpitations   Objective: Vital signs in last 24 hours: Vitals:   01/31/17 1230 01/31/17 1250  BP:  (!) 157/76  Pulse: 71 68  Resp: 16 16  Temp: (!) 97.2 F (36.2 C) 97.7 F (36.5 C)  SpO2: 95% 95%    Physical Exam:  General:  Alert, cooperative, no distress, appears stated age  Head:  Normocephalic, without obvious abnormality, atraumatic  Eyes:  , EOM's intact, mild icterus noted   Lungs:   Clear to auscultation bilaterally, respirations unlabored  Heart:  Regular rate and rhythm, S1, S2 normal  Abdomen:   Soft, mild distended with generalized discomfort on palpation , bowel sounds present   Extremities: Extremities normal, atraumatic, no  edema       Lab Results: Recent Labs    01/30/17 0432 01/31/17 0421  NA 139 138  K 3.6 3.6  CL 108 106  CO2 23 23  GLUCOSE 123* 135*  BUN 8 6  CREATININE 0.85 0.85  CALCIUM 8.4* 8.5*  MG 1.6*  --   PHOS 2.8  --    Recent Labs    01/30/17 0432 01/31/17 0421  AST 91* 87*  ALT 146* 143*  ALKPHOS 110 105  BILITOT 5.3* 3.1*  PROT 6.3* 6.3*  ALBUMIN 2.8* 2.9*   Recent Labs    01/30/17 0432 01/31/17 0421  WBC 6.8 9.7  NEUTROABS 3.1  --   HGB 13.6 13.7  HCT 41.0 41.7  MCV 89.1 89.1  PLT 224 252   No results for input(s): LABPROT, INR in the last 72 hours.    Assessment/Plan: - Choledocholithiasis. S/P ERCP Yesterday  - Chronic Cholecystitis  - Abnormal LFTs. Improving  Recommendations -------------------------- - No further inpatient GI workup  - Advance diet per surgery  - GI will sign off. F/up in GI clinic in 4 weeks. Call us back if needed.    Kathi DerParag Deondria Puryear MD, FACP 01/31/2017, 3:13 PM  Contact #  [phone]

## 2017-01-31 NOTE — Op Note (Signed)
Preoperative diagnosis:choledocholithiasis, gallstones Postoperative diagnosis:chronic cholecystitis Procedure: Laparoscopic cholecystectomy Surgeon: Dr. Harden MoMatt Yonael Middleton Anesthesia: Gen. Specimens: gb to pathology Estimated blood loss: minimal Complications: None Drains: none Sponge count was correct at completion Disposition to recovery stable  Indications: This is a61 yof who was admitted for choledocholithiasis.  She has gallstones on ultrasound.  I discussed a laparoscopic cholecystectomy with her.    Procedure: After informed consent was obtained the patient was taken to the operating room. She was givenantibiotics. SCDs were in place. She was placed undergeneral anesthesia without complication. Her abdomen was prepped and draped in the standard sterile surgical fashion. A surgical timeout was then performed.  I infiltrated marcainebelow the umbilicus.Imade an incisionand identified the fascia. I entered the fascia and incised it. I entered the peritoneum bluntly. I placed a 0 vicryl pursestring suture. I inserted a hasson trocar and insufflated the to 15 mm Hg pressure.  I then placed a 5 mm trocar in theepigastrium.Two5 mm trocars were placed in the right side of the abdomen.the gallbladder did havechroniccholecystitis. Igrasped the gallbladder and retracted cephalad and lateral. This was difficult due to a short cystic duct and fatty liver.  EventuallyI was able to identify the critical view of safety.I then clipped the duct and divided it. The duct was viable. The clips traversed the duct. I then clipped and divided the cystic artery. I then removed the gallbladder from the liver bed. I placed the gallbladderin a bag and removed from the umbilicus. I obtained hemostasis.I did place some snow in the liver bed. I then removed the hasson trocar, tied down the pursestringand closed this with multiple2-0 vicrylsusing the endoclose device.I then removed the  remaining trocars and these were closed with 4-0 Monocryl and glue. She tolerated this well be transferred to the recovery room.

## 2017-01-31 NOTE — Anesthesia Procedure Notes (Signed)
Procedure Name: Intubation Date/Time: 01/31/2017 10:25 AM Performed by: Catalina Gravel, MD Pre-anesthesia Checklist: Patient identified, Emergency Drugs available, Suction available and Patient being monitored Patient Re-evaluated:Patient Re-evaluated prior to induction Oxygen Delivery Method: Circle System Utilized Preoxygenation: Pre-oxygenation with 100% oxygen Induction Type: IV induction Ventilation: Mask ventilation without difficulty and Oral airway inserted - appropriate to patient size Laryngoscope Size: Mac and 3 Grade View: Grade III Tube type: Oral Tube size: 7.0 mm Number of attempts: 2 (X1 by CRNA, x1 by MDA) Airway Equipment and Method: Stylet and Oral airway Placement Confirmation: ETT inserted through vocal cords under direct vision,  positive ETCO2 and breath sounds checked- equal and bilateral Secured at: 21 cm Tube secured with: Tape Dental Injury: Teeth and Oropharynx as per pre-operative assessment  Difficulty Due To: Difficulty was anticipated Future Recommendations: Recommend- induction with short-acting agent, and alternative techniques readily available

## 2017-01-31 NOTE — Progress Notes (Signed)
PROGRESS NOTE    Olivia Middleton  ZOX:[id number] DOB: 01-Nov-1955 DOA: 01/28/2017 PCP: Maurice SmallGriffin, Elaine, MD   Brief Narrative:  Olivia MccoyDebra Buckbee is a 61 y.o. female with medical history significant for Hepatic Steatosis, Diabetes on metformin, Hypertension, and Obesity.  Patient reported that yesterday she had mild right upper quadrant abdominal pain that she felt was related to gas.  She took a Gas-X pill and felt much better after laying down and resting.  She was quite nauseated at the same time.Yesterday morning she was having no symptoms. She ate breakfast and had no symptoms immediately following eating.  She went to work as usual and after being at work about an hour she developed severe right upper quadrant abdominal pain that worsened with deep breath and was quite nauseous.  She went to have a bowel movement and began having diarrhea that was not typical for her baseline.  Because of the severity of symptoms she presented to the ER.  She was worked up for her RUQ pain and found to have Choledocholithiasis and Gastroenterology and General Surgery were consulted for further evaluation and recommendations. Patient to undergo ERCP in AM and likely Lap Chole later this admission.   01/31/17 - Patient seen alongside patient's Husband. Patient underwent lap cholecystectomy earlier today. resting quietly.  Assessment & Plan:   Principal Problem:   Choledocholithiasis Active Problems:   RUQ abdominal pain   Diabetes mellitus   Hypertension   SIRS (systemic inflammatory response syndrome) (HCC)   Abnormal urinalysis   Hepatic steatosis  RUQ abdominal pain with Choledocholithiasis - Patient underwent cholecystectomy earlier today. -Patient presented with abrupt onset of abdominal pain x 2- worst episode today after eating fatty meal with elevation in total bilirubin and ALT/AST concerning for choledocholithiasis -Patient's body habitus and bowel gas interfering with adequate abdominal ultrasound  and therefore undetected cholelithiasis may be present; Ultrasound of the Gallbladder done and showed o definite sonographic findings to suggest cholelithiasis -MRCP done and showed Cholelithiasis, without associated findings to suggest acute cholecystitis. Suspected choledocholithiasis with three mid/distal CBD stones measuring up to 5 mm. No intrahepatic or extrahepatic ductal dilatation. Mild hepatic steatosis with areas of focal fatty sparing. -GI Consulted and recommending ERCP in AM and General Surgery Consult for Laparoscopic Cholecystectomy   -Clear Liquid Diet for now and NPO at Midnight for ERCP -C/w Acetaminophen 650 mg po q6hprn mild pain/fever and IV Morphine 1-4 mg q2hprn for pain -IV Zofran 4 mg q6hprn for Nausea -IV Zosyn for SIRS physiology/possible early Ascending Cholangitis -Blood Cultures x2 obtained and pending  -Hepatitis panel Negative -General Surgery Consulted and liklely will proceed with Lap Chole after ERCP; Will defer timing to General Surgery of when this will take place  SIRS (Systemic Inflammatory Response Syndrome) 2/2 to suspected Obstructive Choledocholithiasis  -Patient presents with acute abdominal pain with significant leukocytosis and elevated lactic acid with source of infection suspected to be gallbladder -Cycled lactic acid and trended down -WBC went from 16.5 -> 9.3 -C/w IVFluid Resuscitation with NS at 150 mL/hr -Empiric Antibiotics with IV Zosyn to cover gram-negative organisms -Follow urine output and vital signs closely-currently did not meet criteria for sepsis  Lactic Acidosis  -Patient's Lactic Acid Level improved from 2.44 -> 1.6 -C/w IVF Rehydration with NS at 150 mL/hr  Abnormal LFT's/Transaminitis  -Likely 2/2 Above -AST went from 195 -> 121 -ALT went from 206 -> 171 -Likely 2/2 to Obstructive Choledocholithiasis  -Repeat CMP in AM -Patient to undergo ERCP in AM  Hyperbilirubinemia -Patient's T  Bili went from 6.7 -> 7.9 -Likely  from Obstructive Choledocholithiasis   Diabetes Mellitus -Hold Home metformin acutely -C/w Sensitive Novolog SSI q4h -HgbA1c done and was 6.5 -CBG's ranging from 127-160  Essential Hypertension -Current blood pressure suboptimal in setting of gram-negative SIRS/? Evolving sepsis therefore holding home antihypertensive medications of Amlodipine 5 mg po Daily and Lisinopril 5 mg po Daily -Continue to Monitor   Hepatic Steatosis -Ultrasound of the Liver showed Diffusely increased echogenicity suggests steatosis. Portal vein is patent on color Doppler imaging with normal direction of blood flow towards the liver. -MRCP showed mild hepatic steatosis with areas of focal fatty sparing. -At baseline transaminases have been normal -Repeat CMP in AM  -Encourage Weight Loss and Diet Counseling   Abnormal Urinalysis -Urinalysis showed Hazy Appearance, Moderate Bilirubin, Small Hgb, Rare Bacteria, 6-30 WBC's -Obtain Urine Culture and pending -Empiric antibiotics as above  Mildly Elevated Cr -Patient's Cr worsened slightly 0.98 -> 1.02 -Unclear Baseline -C/w IVF as above  DVT prophylaxis: Enoxaparin 40 mg sq q24h Code Status: FULL CODE Family Communication: Discussed with Family present at bedside Disposition Plan: Remain Inpatient for ERCP and likely Lap Chole this Admission   Consultants:   Gastroenterology   General Surgery   Procedures: MRCP   Antimicrobials:  Anti-infectives (From admission, onward)   Start     Dose/Rate Route Frequency Ordered Stop   01/28/17 2000  piperacillin-tazobactam (ZOSYN) IVPB 3.375 g     3.375 g 12.5 mL/hr over 240 Minutes Intravenous Every 8 hours 01/28/17 1510     01/28/17 1230  piperacillin-tazobactam (ZOSYN) IVPB 3.375 g     3.375 g 100 mL/hr over 30 Minutes Intravenous  Once 01/28/17 1226 01/28/17 1452     Subjective: Seen and examined at bedside and states abdominal pain was improved but she was still nauseous. No CP or SOB. No other  concerns or complaints at this time.   Objective: Vitals:   01/31/17 1215 01/31/17 1227 01/31/17 1230 01/31/17 1250  BP: 114/63 (!) 118/59  (!) 157/76  Pulse: 66 68 71 68  Resp: 12 13 16 16   Temp:   (!) 97.2 F (36.2 C) 97.7 F (36.5 C)  TempSrc:    Oral  SpO2: 92% 91% 95% 95%  Weight:      Height:        Intake/Output Summary (Last 24 hours) at 01/31/2017 1346 Last data filed at 01/31/2017 1340 Gross per 24 hour  Intake 2786.67 ml  Output 2025 ml  Net 761.67 ml   Filed Weights   01/29/17 0844 01/30/17 0426 01/30/17 0700  Weight: (!) 140.1 kg (308 lb 13.8 oz) (!) 139.6 kg (307 lb 12.2 oz) (!) 139.6 kg (307 lb 12.2 oz)   Examination: Physical Exam:  Constitutional: WN/WD obese jaundiced appearing Caucasian female in NAD and appears calm  Eyes: Lids and conjunctivae normal, sclerae icteric  ENMT: External Ears, Nose appear normal. Grossly normal hearing. Mucous membranes are moist. Neck: Appears normal, supple, no cervical masses, normal ROM, no appreciable thyromegaly; no JVD Respiratory: Diminished to auscultation bilaterally, no wheezing, rales, rhonchi or crackles. Normal respiratory effort and patient is not tachypenic. No accessory muscle use.  Cardiovascular: RRR, no murmurs / rubs / gallops. S1 and S2 auscultated. No extremity edema.  Abdomen: Soft, Tender to palpate in RUQ and Mid Epigastrum, Distended 2/2 body habitus. No masses palpated. No appreciable hepatosplenomegaly. Bowel sounds positive x4.  GU: Deferred. Musculoskeletal: No clubbing / cyanosis of digits/nails. No joint deformity upper and lower extremities.  Skin: No rashes, lesions, ulcers on a limited skin eval. No induration; Warm and dry.  Neurologic: CN 2-12 grossly intact with no focal deficits. Romberg sign cerebellar reflexes not assessed.  Psychiatric: Normal judgment and insight. Alert and oriented x 3. Pleasant and Normal mood and appropriate affect.   Data Reviewed: I have personally reviewed  following labs and imaging studies  CBC: Recent Labs  Lab 01/28/17 1113 01/29/17 0345 01/30/17 0432 01/31/17 0421  WBC 16.5* 9.3 6.8 9.7  NEUTROABS  --   --  3.1  --   HGB 16.2* 13.6 13.6 13.7  HCT 47.6* 41.4 41.0 41.7  MCV 88.5 89.2 89.1 89.1  PLT 258 218 224 252   Basic Metabolic Panel: Recent Labs  Lab 01/28/17 1113 01/29/17 0345 01/30/17 0432 01/31/17 0421  NA 138 136 139 138  K 3.8 3.6 3.6 3.6  CL 104 104 108 106  CO2 24 24 23 23   GLUCOSE 139* 150* 123* 135*  BUN 13 14 8 6   CREATININE 0.98 1.02* 0.85 0.85  CALCIUM 9.2 8.3* 8.4* 8.5*  MG  --   --  1.6*  --   PHOS  --   --  2.8  --    GFR: Estimated Creatinine Clearance: 103.4 mL/min (by C-G formula based on SCr of 0.85 mg/dL). Liver Function Tests: Recent Labs  Lab 01/28/17 1113 01/29/17 0345 01/30/17 0432 01/31/17 0421  AST 195* 121* 91* 87*  ALT 206* 171* 146* 143*  ALKPHOS 118 103 110 105  BILITOT 6.7* 7.9* 5.3* 3.1*  PROT 7.5 6.2* 6.3* 6.3*  ALBUMIN 3.5 2.8* 2.8* 2.9*   Recent Labs  Lab 01/28/17 1113  LIPASE 31   No results for input(s): AMMONIA in the last 168 hours. Coagulation Profile: No results for input(s): INR, PROTIME in the last 168 hours. Cardiac Enzymes: No results for input(s): CKTOTAL, CKMB, CKMBINDEX, TROPONINI in the last 168 hours. BNP (last 3 results) No results for input(s): PROBNP in the last 8760 hours. HbA1C: Recent Labs    01/28/17 1621  HGBA1C 6.5*   CBG: Recent Labs  Lab 01/30/17 2024 01/31/17 0011 01/31/17 0417 01/31/17 0756 01/31/17 1232  GLUCAP 181* 149* 139* 118* 149*   Lipid Profile: No results for input(s): CHOL, HDL, LDLCALC, TRIG, CHOLHDL, LDLDIRECT in the last 72 hours. Thyroid Function Tests: No results for input(s): TSH, T4TOTAL, FREET4, T3FREE, THYROIDAB in the last 72 hours. Anemia Panel: No results for input(s): VITAMINB12, FOLATE, FERRITIN, TIBC, IRON, RETICCTPCT in the last 72 hours. Sepsis Labs: Recent Labs  Lab 01/28/17 1152  01/28/17 1419 01/28/17 1631 01/28/17 2017  LATICACIDVEN 2.94* 2.44* 1.4 1.6   Recent Results (from the past 240 hour(s))  Culture, blood (Routine X 2) w Reflex to ID Panel     Status: None (Preliminary result)   Collection Time: 01/28/17  4:09 PM  Result Value Ref Range Status   Specimen Description BLOOD BLOOD RIGHT HAND  Final   Special Requests   Final    BOTTLES DRAWN AEROBIC AND ANAEROBIC Blood Culture adequate volume   Culture NO GROWTH 2 DAYS  Final   Report Status PENDING  Incomplete  Culture, Urine     Status: None   Collection Time: 01/28/17  4:20 PM  Result Value Ref Range Status   Specimen Description URINE, RANDOM  Final   Special Requests NONE  Final   Culture NO GROWTH  Final   Report Status 01/29/2017 FINAL  Final  Culture, blood (Routine X 2) w Reflex to ID  Panel     Status: None (Preliminary result)   Collection Time: 01/28/17  4:40 PM  Result Value Ref Range Status   Specimen Description BLOOD LEFT ANTECUBITAL  Final   Special Requests   Final    BOTTLES DRAWN AEROBIC AND ANAEROBIC Blood Culture results may not be optimal due to an excessive volume of blood received in culture bottles   Culture NO GROWTH 2 DAYS  Final   Report Status PENDING  Incomplete  Surgical pcr screen     Status: None   Collection Time: 01/30/17  4:34 PM  Result Value Ref Range Status   MRSA, PCR NEGATIVE NEGATIVE Final   Staphylococcus aureus NEGATIVE NEGATIVE Final    Comment: (NOTE) The Xpert SA Assay (FDA approved for NASAL specimens in patients 50 years of age and older), is one component of a comprehensive surveillance program. It is not intended to diagnose infection nor to guide or monitor treatment.      Radiology Studies: Dg Ercp With Sphincterotomy  Result Date: 01/30/2017 CLINICAL DATA:  Suggestion of choledocholithiasis by MRCP. EXAM: ERCP TECHNIQUE: Multiple spot images obtained with the fluoroscopic device and submitted for interpretation post-procedure.  COMPARISON:  MRCP study on 01/29/2017 FINDINGS: Images submitted during the ERCP procedure demonstrate subtle filling defects within a nondilated common bile duct. Balloon extraction was performed with completion balloon occlusion cholangiogram showing no further filling defects. IMPRESSION: Filling defects in the common bile duct consistent with choledocholithiasis. After balloon sweep extraction, no further filling defects are identified. These images were submitted for radiologic interpretation only. Please see the procedural report for the amount of contrast and the fluoroscopy time utilized. Electronically Signed   By: Irish Lack M.D.   On: 01/30/2017 09:25   Scheduled Meds: . chlorhexidine  15 mL Mouth Rinse BID  . enoxaparin (LOVENOX) injection  40 mg Subcutaneous Q24H  . insulin aspart  0-9 Units Subcutaneous Q4H  . mouth rinse  15 mL Mouth Rinse q12n4p  . sodium chloride flush  3 mL Intravenous Q12H   Continuous Infusions: . sodium chloride 75 mL/hr at 01/31/17 1250  . piperacillin-tazobactam (ZOSYN)  IV Stopped (01/31/17 0845)    LOS: 3 days   Barnetta Chapel, MD [facility] Pager [phone].  If 7PM-7AM, please contact night-coverage [email or web address] Password [email or web address] 01/31/2017, 1:46 PM

## 2017-01-31 NOTE — Anesthesia Preprocedure Evaluation (Signed)
Anesthesia Evaluation  Patient identified by MRN, date of birth, ID band Patient awake    Reviewed: Allergy & Precautions, NPO status , Patient's Chart, lab work & pertinent test results  Airway Mallampati: III  TM Distance: >3 FB Neck ROM: Full    Dental  (+) Teeth Intact, Dental Advisory Given   Pulmonary former smoker,    Pulmonary exam normal breath sounds clear to auscultation       Cardiovascular hypertension, Pt. on medications (-) CAD, (-) Past MI and (-) CHF Normal cardiovascular exam Rhythm:Regular Rate:Normal     Neuro/Psych negative neurological ROS  negative psych ROS   GI/Hepatic negative GI ROS, Neg liver ROS,   Endo/Other  diabetes, Type 2, Oral Hypoglycemic AgentsMorbid obesity  Renal/GU negative Renal ROS     Musculoskeletal negative musculoskeletal ROS (+)   Abdominal   Peds  Hematology negative hematology ROS (+)   Anesthesia Other Findings Day of surgery medications reviewed with the patient.  Reproductive/Obstetrics negative OB ROS                             Anesthesia Physical Anesthesia Plan  ASA: III  Anesthesia Plan: General   Post-op Pain Management:    Induction: Intravenous  PONV Risk Score and Plan: 4 or greater and Scopolamine patch - Pre-op, Midazolam, Dexamethasone and Ondansetron  Airway Management Planned: Oral ETT  Additional Equipment:   Intra-op Plan:   Post-operative Plan: Extubation in OR  Informed Consent: I have reviewed the patients History and Physical, chart, labs and discussed the procedure including the risks, benefits and alternatives for the proposed anesthesia with the patient or authorized representative who has indicated his/her understanding and acceptance.   Dental advisory given  Plan Discussed with: CRNA  Anesthesia Plan Comments: (Risks/benefits of general anesthesia discussed with patient including risk of damage  to teeth, lips, gum, and tongue, nausea/vomiting, allergic reactions to medications, and the possibility of heart attack, stroke and death.  All patient questions answered.  Patient wishes to proceed.)        Anesthesia Quick Evaluation

## 2017-01-31 NOTE — Progress Notes (Signed)
1 Day Post-Op   Subjective/Chief Complaint: Mild abd pain   Objective: Vital signs in last 24 hours: Temp:  [97.6 F (36.4 C)-98.8 F (37.1 C)] 97.6 F (36.4 C) (12/02 0639) Pulse Rate:  [63-70] 66 (12/02 0639) Resp:  [14-18] 17 (12/02 0639) BP: (108-168)/(45-81) 108/45 (12/02 0639) SpO2:  [92 %-98 %] 96 % (12/02 0639) Last BM Date: 01/30/17  Intake/Output from previous day: 12/01 0701 - 12/02 0700 In: 1975 [P.O.:480; I.V.:1395; IV Piggyback:100] Out: 600 [Urine:600] Intake/Output this shift: No intake/output data recorded.  Mild ruq pain  Lab Results:  Recent Labs    01/30/17 0432 01/31/17 0421  WBC 6.8 9.7  HGB 13.6 13.7  HCT 41.0 41.7  PLT 224 252   BMET Recent Labs    01/30/17 0432 01/31/17 0421  NA 139 138  K 3.6 3.6  CL 108 106  CO2 23 23  GLUCOSE 123* 135*  BUN 8 6  CREATININE 0.85 0.85  CALCIUM 8.4* 8.5*   PT/INR No results for input(s): LABPROT, INR in the last 72 hours. ABG No results for input(s): PHART, HCO3 in the last 72 hours.  Invalid input(s): PCO2, PO2  Studies/Results: Dg Ercp With Sphincterotomy  Result Date: 01/30/2017 CLINICAL DATA:  Suggestion of choledocholithiasis by MRCP. EXAM: ERCP TECHNIQUE: Multiple spot images obtained with the fluoroscopic device and submitted for interpretation post-procedure. COMPARISON:  MRCP study on 01/29/2017 FINDINGS: Images submitted during the ERCP procedure demonstrate subtle filling defects within a nondilated common bile duct. Balloon extraction was performed with completion balloon occlusion cholangiogram showing no further filling defects. IMPRESSION: Filling defects in the common bile duct consistent with choledocholithiasis. After balloon sweep extraction, no further filling defects are identified. These images were submitted for radiologic interpretation only. Please see the procedural report for the amount of contrast and the fluoroscopy time utilized. Electronically Signed   By: Irish LackGlenn   Yamagata M.D.   On: 01/30/2017 09:25    Anti-infectives: Anti-infectives (From admission, onward)   Start     Dose/Rate Route Frequency Ordered Stop   01/28/17 2000  piperacillin-tazobactam (ZOSYN) IVPB 3.375 g     3.375 g 12.5 mL/hr over 240 Minutes Intravenous Every 8 hours 01/28/17 1510     01/28/17 1230  piperacillin-tazobactam (ZOSYN) IVPB 3.375 g     3.375 g 100 mL/hr over 30 Minutes Intravenous  Once 01/28/17 1226 01/28/17 1452      Assessment/Plan: Choledocholithiasis  Ercp cleared duct yesterday Plan for lap chole today I discussed the procedure in detail.  We discussed the risks and benefits of a laparoscopic cholecystectomy including, but not limited to bleeding, infection, injury to surrounding structures such as the intestine or liver, bile leak, retained gallstones, need to convert to an open procedure, prolonged diarrhea, blood clots such as  DVT, common bile duct injury.    Emelia LoronMatthew Arlisa Leclere 01/31/2017

## 2017-01-31 NOTE — Progress Notes (Signed)
Pt to OR via bed, Dr. Dwain SarnaWakefield for Lap Chole, gave report to Lorenda IshiharaBonnie Gibbs, RN in Short Stay.

## 2017-01-31 NOTE — Anesthesia Postprocedure Evaluation (Signed)
Anesthesia Post Note  Patient: Olivia MccoyDebra Royster  Procedure(s) Performed: LAPAROSCOPIC CHOLECYSTECTOMY (N/A Abdomen)     Patient location during evaluation: PACU Anesthesia Type: General Level of consciousness: awake and alert Pain management: pain level controlled Vital Signs Assessment: post-procedure vital signs reviewed and stable Respiratory status: spontaneous breathing, nonlabored ventilation and respiratory function stable Cardiovascular status: blood pressure returned to baseline and stable Postop Assessment: no apparent nausea or vomiting Anesthetic complications: no    Last Vitals:  Vitals:   01/31/17 1230 01/31/17 1250  BP:  (!) 157/76  Pulse: 71 68  Resp: 16 16  Temp: (!) 36.2 C 36.5 C  SpO2: 95% 95%    Last Pain:  Vitals:   01/31/17 1250  TempSrc: Oral  PainSc: 6                  Cecile HearingStephen Edward Afton Mikelson

## 2017-02-01 ENCOUNTER — Encounter (HOSPITAL_COMMUNITY): Payer: Self-pay | Admitting: General Surgery

## 2017-02-01 DIAGNOSIS — K8021 Calculus of gallbladder without cholecystitis with obstruction: Secondary | ICD-10-CM

## 2017-02-01 DIAGNOSIS — K8050 Calculus of bile duct without cholangitis or cholecystitis without obstruction: Secondary | ICD-10-CM

## 2017-02-01 DIAGNOSIS — E119 Type 2 diabetes mellitus without complications: Secondary | ICD-10-CM

## 2017-02-01 LAB — GLUCOSE, CAPILLARY
GLUCOSE-CAPILLARY: 122 mg/dL — AB (ref 65–99)
GLUCOSE-CAPILLARY: 175 mg/dL — AB (ref 65–99)
Glucose-Capillary: 123 mg/dL — ABNORMAL HIGH (ref 65–99)
Glucose-Capillary: 94 mg/dL (ref 65–99)

## 2017-02-01 LAB — COMPREHENSIVE METABOLIC PANEL
ALK PHOS: 83 U/L (ref 38–126)
ALT: 121 U/L — ABNORMAL HIGH (ref 14–54)
ANION GAP: 7 (ref 5–15)
AST: 67 U/L — ABNORMAL HIGH (ref 15–41)
Albumin: 2.6 g/dL — ABNORMAL LOW (ref 3.5–5.0)
BILIRUBIN TOTAL: 1.9 mg/dL — AB (ref 0.3–1.2)
BUN: 9 mg/dL (ref 6–20)
CALCIUM: 8.2 mg/dL — AB (ref 8.9–10.3)
CO2: 25 mmol/L (ref 22–32)
Chloride: 107 mmol/L (ref 101–111)
Creatinine, Ser: 0.8 mg/dL (ref 0.44–1.00)
GFR calc non Af Amer: >60 mL/min (ref >60)
GLUCOSE: 118 mg/dL — AB (ref 65–99)
Potassium: 3.8 mmol/L (ref 3.5–5.1)
Sodium: 139 mmol/L (ref 135–145)
TOTAL PROTEIN: 5.7 g/dL — AB (ref 6.5–8.1)

## 2017-02-01 MED ORDER — OXYCODONE HCL 10 MG PO TABS: 10.0000 mg | ORAL_TABLET | ORAL | 0 refills | Status: AC | PRN

## 2017-02-01 MED ORDER — ONDANSETRON HCL 4 MG PO TABS: 4.0000 mg | ORAL_TABLET | Freq: Three times a day (TID) | ORAL | 0 refills | Status: AC | PRN

## 2017-02-01 NOTE — Discharge Instructions (Signed)
Please arrive at least 30 min before your appointment to complete your check in paperwork.  If you are unable to arrive 30 min prior to your appointment time we may have to cancel or reschedule you.  LAPAROSCOPIC SURGERY: POST OP INSTRUCTIONS  1. DIET: Follow a light bland diet the first 24 hours after arrival home, such as soup, liquids, crackers, etc. Be sure to include lots of fluids daily. Avoid fast food or heavy meals as your are more likely to get nauseated. Eat a low fat the next few days after surgery.  2. Take your usually prescribed home medications unless otherwise directed. 3. PAIN CONTROL:  1. Pain is best controlled by a usual combination of three different methods TOGETHER:  1. Ice/Heat 2. Over the counter pain medication 3. Prescription pain medication 2. Most patients will experience some swelling and bruising around the incisions. Ice packs or heating pads (30-60 minutes up to 6 times a day) will help. Use ice for the first few days to help decrease swelling and bruising, then switch to heat to help relax tight/sore spots and speed recovery. Some people prefer to use ice alone, heat alone, alternating between ice & heat. Experiment to what works for you. Swelling and bruising can take several weeks to resolve.  3. It is helpful to take an over-the-counter pain medication regularly for the first few weeks. Choose one of the following that works best for you:  1. Naproxen (Aleve, etc) Two 220mg  tabs twice a day 2. Ibuprofen (Advil, etc) Three 200mg  tabs four times a day (every meal & bedtime) 3. Acetaminophen (Tylenol, etc) 500-650mg  four times a day (every meal & bedtime) 4. A prescription for pain medication (such as oxycodone, hydrocodone, etc) should be given to you upon discharge. Take your pain medication as prescribed.  1. If you are having problems/concerns with the prescription medicine (does not control pain, nausea, vomiting, rash, itching, etc), please call us [phone]  [phone] to see if we need to switch you to a different pain medicine that will work better for you and/or control your side effect better. 2. If you need a refill on your pain medication, please contact your pharmacy. They will contact our office to request authorization. Prescriptions will not be filled after 5 pm or on week-ends. 4. Avoid getting constipated. Between the surgery and the pain medications, it is common to experience some constipation. Increasing fluid intake and taking a fiber supplement (such as Metamucil, Citrucel, FiberCon, MiraLax, etc) 1-2 times a day regularly will usually help prevent this problem from occurring. A mild laxative (prune juice, Milk of Magnesia, MiraLax, etc) should be taken according to package directions if there are no bowel movements after 48 hours.  5. Watch out for diarrhea. If you have many loose bowel movements, simplify your diet to bland foods & liquids for a few days. Stop any stool softeners and decrease your fiber supplement. Switching to mild anti-diarrheal medications (Kayopectate, Pepto Bismol) can help. If this worsens or does not improve, please call us. 6. Wash / shower every day. You may shower over the dressings as they are waterproof. You may leave the incision open to air. You may replace a dressing/Band-Aid to cover the incision for comfort if you wish. Do not bathe for 2 weeks 7. ACTIVITIES as tolerated:  1. You may resume regular (light) daily activities beginning the next day--such as daily self-care, walking, climbing stairs--gradually increasing activities as tolerated. If you can walk 30 minutes without difficulty, it is  safe to try more intense activity such as jogging, treadmill, bicycling, low-impact aerobics, swimming, etc. 2. Save the most intensive and strenuous activity for last such as sit-ups, heavy lifting, contact sports, etc Refrain from any heavy lifting or straining until you are off narcotics for pain control.  3. DO NOT  PUSH THROUGH PAIN. Let pain be your guide: If it hurts to do something, don't do it. Pain is your body warning you to avoid that activity for another week until the pain goes down. 4. You may drive when you are no longer taking prescription pain medication, you can comfortably wear a seatbelt, and you can safely maneuver your car and apply brakes. 5. You may have sexual intercourse when it is comfortable.  8. FOLLOW UP in our office  1. Please call [facility] at [phone] to set up an appointment to see your surgeon in the office for a follow-up appointment approximately 2-3 weeks after your surgery. 2. Make sure that you call for this appointment the day you arrive home to insure a convenient appointment time.      10. IF YOU HAVE DISABILITY OR FAMILY LEAVE FORMS, BRING THEM TO THE               OFFICE FOR PROCESSING.   WHEN TO CALL us [phone]:  1. Poor pain control 2. Reactions / problems with new medications (rash/itching, nausea, etc)  3. Fever over 101.5 F (38.5 C) 4. Inability to urinate 5. Nausea and/or vomiting 6. Worsening swelling or bruising 7. Continued bleeding from incision. 8. Increased pain, redness, or drainage from the incision  The clinic staff is available to answer your questions during regular business hours (8:30am-5pm). Please dont hesitate to call and ask to speak to one of our nurses for clinical concerns.  If you have a medical emergency, go to the nearest emergency room or call 911.  A surgeon from [facility] is always on call at the [facility], [place]  [address], [place], [place] [address] ?  MAIN: [phone] ? TOLL FREE: [phone] ?  FAX [phone]  [email or web address]   Laparoscopic Cholecystectomy Laparoscopic cholecystectomy is surgery to remove the gallbladder. The gallbladder is a pear-shaped organ that lies beneath the liver on the right side of the body. The  gallbladder stores bile, which is a fluid that helps the body to digest fats. Cholecystectomy is often done for inflammation of the gallbladder (cholecystitis). This condition is usually caused by a buildup of gallstones (cholelithiasis) in the gallbladder. Gallstones can block the flow of bile, which can result in inflammation and pain. In severe cases, emergency surgery may be required. This procedure is done though small incisions in your abdomen (laparoscopic surgery). A thin scope with a camera (laparoscope) is inserted through one incision. Thin surgical instruments are inserted through the other incisions. In some cases, a laparoscopic procedure may be turned into a type of surgery that is done through a larger incision (open surgery). Tell a health care provider about:  Any allergies you have.  All medicines you are taking, including vitamins, herbs, eye drops, creams, and over-the-counter medicines.  Any problems you or family members have had with anesthetic medicines.  Any blood disorders you have.  Any surgeries you have had.  Any medical conditions you have.  Whether you are pregnant or may be pregnant. What are the risks? Generally, this is a safe procedure. However, problems may occur, including:  Infection.  Bleeding.  Allergic reactions to medicines.  Damage to other structures or organs.  A stone remaining in the common bile duct. The common bile duct carries bile from the gallbladder into the small intestine.  A bile leak from the cyst duct that is clipped when your gallbladder is removed.  What happens before the procedure? Staying hydrated Follow instructions from your health care provider about hydration, which may include:  Up to 2 hours before the procedure - you may continue to drink clear liquids, such as water, clear fruit juice, black coffee, and plain tea.  Eating and drinking restrictions Follow instructions from your health care provider about  eating and drinking, which may include:  8 hours before the procedure - stop eating heavy meals or foods such as meat, fried foods, or fatty foods.  6 hours before the procedure - stop eating light meals or foods, such as toast or cereal.  6 hours before the procedure - stop drinking milk or drinks that contain milk.  2 hours before the procedure - stop drinking clear liquids.  Medicines  Ask your health care provider about: ? Changing or stopping your regular medicines. This is especially important if you are taking diabetes medicines or blood thinners. ? Taking medicines such as aspirin and ibuprofen. These medicines can thin your blood. Do not take these medicines before your procedure if your health care provider instructs you not to.  You may be given antibiotic medicine to help prevent infection. General instructions  Let your health care provider know if you develop a cold or an infection before surgery.  Plan to have someone take you home from the hospital or clinic.  Ask your health care provider how your surgical site will be marked or identified. What happens during the procedure?  To reduce your risk of infection: ? Your health care team will wash or sanitize their hands. ? Your skin will be washed with soap. ? Hair may be removed from the surgical area.  An IV tube may be inserted into one of your veins.  You will be given one or more of the following: ? A medicine to help you relax (sedative). ? A medicine to make you fall asleep (general anesthetic).  A breathing tube will be placed in your mouth.  Your surgeon will make several small cuts (incisions) in your abdomen.  The laparoscope will be inserted through one of the small incisions. The camera on the laparoscope will send images to a TV screen (monitor) in the operating room. This lets your surgeon see inside your abdomen.  Air-like gas will be pumped into your abdomen. This will expand your abdomen to  give the surgeon more room to perform the surgery.  Other tools that are needed for the procedure will be inserted through the other incisions. The gallbladder will be removed through one of the incisions.  Your common bile duct may be examined. If stones are found in the common bile duct, they may be removed.  After your gallbladder has been removed, the incisions will be closed with stitches (sutures), staples, or skin glue.  Your incisions may be covered with a bandage (dressing). The procedure may vary among health care providers and hospitals. What happens after the procedure?  Your blood pressure, heart rate, breathing rate, and blood oxygen level will be monitored until the medicines you were given have worn off.  You will be given medicines as needed to control your pain.  Do not drive for  24 hours if you were given a sedative. This information is not intended to replace advice given to you by your health care provider. Make sure you discuss any questions you have with your health care provider. Document Released: 02/16/2005 Document Revised: 09/08/2015 Document Reviewed: 08/05/2015 [employer] Interactive Patient Education  2018 [employer]

## 2017-02-01 NOTE — Discharge Summary (Signed)
Physician Discharge Summary  Eliezer MccoyDebra Ala ZOX:[id number] DOB: 07-04-1955 DOA: 01/28/2017  PCP: Maurice SmallGriffin, Elaine, MD  Admit date: 01/28/2017 Discharge date: 02/01/2017  Recommendations for Outpatient Follow-up:  1. F/u ERCP and lap chole   Follow-up Information    [facility], [facility]. Schedule an appointment as soon as possible for a visit in 4 day(s).   Contact information: [address] [place] [address] [phone]        [facility] [place]. Go on 02/16/2017.   Specialty:  General Surgery Why:  at 10:30 am for your follow up appointment. Please arrive 30 minutes prior to complete paperwork. Please bring photo ID and insurance card Contact information: [address] [place] [id number] [phone]           Discharge Diagnoses:  1. Choledocholithiasis  2. Cholelithiasis 3. Elevated LFTs 4. DM type 2  Discharge Condition: improved Disposition: home  Diet recommendation: heart healthy diabetic diet  Filed Weights   01/30/17 0426 01/30/17 0700 02/01/17 0500  Weight: (!) 139.6 kg (307 lb 12.2 oz) (!) 139.6 kg (307 lb 12.2 oz) (!) 144.2 kg (318 lb)    History of present illness:  61yow PMH DM presented with RUQ pain, nausea. LFTs were elevated though cholecystitis was not seen, bilary colic was suspected. Admitted for further evaluation.   Hospital Course:  Seen by GI and general surgery. MRCP revealed CBD stones. Underwent ERCP 12/1 with biliary sphincterotomy performed. Underwent lap chole 12/2. Cleared for discharge 12/3.  Choledocholithiasis with elevated LFTs treated with ERCP followed by cholecystectomy. F/u with GI in 4 weeks.  DM type 2 on metformin - stable during hospitalization  Consultants: GI General surgery  Procedures:  ERCP Impression:               - The major papilla appeared normal.                           - A filling defect consistent with a stone and               sludge was seen on the cholangiogram.                           - The common bile duct was mildly dilated.                           - Choledocholithiasis was found. Complete removal                            was accomplished by biliary sphincterotomy and                            balloon extraction.                           - A biliary sphincterotomy was performed.                           - The biliary tree was swept.  Laparoscopic cholecystectomy   Today's assessment: S: feels well, abd sore but tolerated lunch. O: Vitals:  Vitals:   02/01/17 0500 02/01/17 1432  BP: 133/66 (!) 104/51  Pulse: 63 60  Resp: 18 18  Temp: 98.4 F (36.9 C) 98.5 F (36.9 C)  SpO2: 94% 95%    Constitutional:  . Appears calm and comfortable. Walking in hall with husband. Respiratory:  . CTA bilaterally, no w/r/r.  . Respiratory effort normal.  Cardiovascular:  . RRR, no m/r/g Psychiatric:  . judgement and insight appear normal   Discharge Instructions  Discharge Instructions    Diet - low sodium heart healthy   Complete by:  As directed    Diet Carb Modified   Complete by:  As directed    Discharge instructions   Complete by:  As directed    Call your physician or seek immediate medical attention for fever, pain, vomiting, drainage or worsening of condition.   Increase activity slowly   Complete by:  As directed      Allergies as of 02/01/2017      Reactions   Erythromycin    Tetracyclines & Related       Medication List    STOP taking these medications   amLODipine 5 MG tablet Commonly known as:  NORVASC   Cholecalciferol 50000 units capsule     TAKE these medications   lisinopril 5 MG tablet Commonly known as:  PRINIVIL,ZESTRIL Take 1 tablet (5 mg total) by mouth daily.   metFORMIN 1000 MG tablet Commonly known as:  GLUCOPHAGE TAKE 1 TABLET BY MOUTH 2 TIMES DAILY WITH A MEAL. What changed:  See the new instructions.   ondansetron 4 MG  tablet Commonly known as:  ZOFRAN Take 1 tablet (4 mg total) by mouth every 8 (eight) hours as needed for nausea or vomiting.   Oxycodone HCl 10 MG Tabs Take 1 tablet (10 mg total) by mouth every 4 (four) hours as needed for moderate pain.      Allergies  Allergen Reactions  . Erythromycin   . Tetracyclines & Related     The results of significant diagnostics from this hospitalization (including imaging, microbiology, ancillary and laboratory) are listed below for reference.    Significant Diagnostic Studies: Mr 3d Recon At Scanner  Result Date: 01/29/2017 CLINICAL DATA:  Abnormal LFTs EXAM: MRI ABDOMEN WITHOUT AND WITH CONTRAST (INCLUDING MRCP) TECHNIQUE: Multiplanar multisequence MR imaging of the abdomen was performed both before and after the administration of intravenous contrast. Heavily T2-weighted images of the biliary and pancreatic ducts were obtained, and three-dimensional MRCP images were rendered by post processing. CONTRAST:  20mL MULTIHANCE GADOBENATE DIMEGLUMINE 529 MG/ML IV SOLN COMPARISON:  Right upper quadrant ultrasound dated 01/28/2017 FINDINGS: Lower chest: Lung bases are clear. Hepatobiliary: Mild hepatic steatosis with areas of focal fatty sparing. No suspicious/enhancing hepatic lesion is seen. Layering tiny gallstones with gallbladder sludge (series 6/image 20), with associated gallbladder wall thickening. No pericholecystic fluid or inflammatory changes. Fundal adenomyomatosis (series 1502/image 60), benign. No intrahepatic duct dilatation. Common duct measures 7 mm, at the upper limits of normal. However, three mid/distal CBD stones are suspected (series 7/images 18, 25, and 27), measuring up to 5 mm. Associated filling defect in the distal CBD on coronal MRCP (series 9/ image 35). Pancreas:  Within normal limits. Spleen:  Within normal limits. Adrenals/Urinary Tract: Mild thickening of the left adrenal gland. Right adrenal glands within normal limits. Kidneys are  within normal limits.  No hydronephrosis. Stomach/Bowel: Stomach is within normal limits. Visualized bowel is grossly unremarkable. Vascular/Lymphatic:  No evidence of abdominal aortic aneurysm. No suspicious abdominal lymphadenopathy. Other:  No abdominal ascites. Musculoskeletal: No focal osseous lesions. IMPRESSION: Cholelithiasis, without associated findings  to suggest acute cholecystitis. Suspected choledocholithiasis with three mid/distal CBD stones measuring up to 5 mm. No intrahepatic or extrahepatic ductal dilatation. Consider ERCP as clinically warranted. Mild hepatic steatosis with areas of focal fatty sparing. Electronically Signed   By: Charline Bills M.D.   On: 01/29/2017 07:17   US Abdomen Limited  Result Date: 01/28/2017 CLINICAL DATA:  Right upper quadrant pain for 2 days. EXAM: ULTRASOUND ABDOMEN LIMITED RIGHT UPPER QUADRANT COMPARISON:  08/21/2014 FINDINGS: Gallbladder: Not well visualized due to bowel gas and body habitus. No stones are evident. Gallbladder wall measures upper normal for thickness. The sonographer reports no sonographic Murphy sign. Common bile duct: Diameter: 5 mm Liver: Diffusely increased echogenicity suggests steatosis. Portal vein is patent on color Doppler imaging with normal direction of blood flow towards the liver. IMPRESSION: No definite sonographic findings to suggest cholelithiasis. Electronically Signed   By: Kennith Center M.D.   On: 01/28/2017 13:21   Dg Ercp With Sphincterotomy  Result Date: 01/30/2017 CLINICAL DATA:  Suggestion of choledocholithiasis by MRCP. EXAM: ERCP TECHNIQUE: Multiple spot images obtained with the fluoroscopic device and submitted for interpretation post-procedure. COMPARISON:  MRCP study on 01/29/2017 FINDINGS: Images submitted during the ERCP procedure demonstrate subtle filling defects within a nondilated common bile duct. Balloon extraction was performed with completion balloon occlusion cholangiogram showing no further  filling defects. IMPRESSION: Filling defects in the common bile duct consistent with choledocholithiasis. After balloon sweep extraction, no further filling defects are identified. These images were submitted for radiologic interpretation only. Please see the procedural report for the amount of contrast and the fluoroscopy time utilized. Electronically Signed   By: Irish Lack M.D.   On: 01/30/2017 09:25   Mr Abdomen Mrcp Vivien Rossetti Contast  Result Date: 01/29/2017 CLINICAL DATA:  Abnormal LFTs EXAM: MRI ABDOMEN WITHOUT AND WITH CONTRAST (INCLUDING MRCP) TECHNIQUE: Multiplanar multisequence MR imaging of the abdomen was performed both before and after the administration of intravenous contrast. Heavily T2-weighted images of the biliary and pancreatic ducts were obtained, and three-dimensional MRCP images were rendered by post processing. CONTRAST:  20mL MULTIHANCE GADOBENATE DIMEGLUMINE 529 MG/ML IV SOLN COMPARISON:  Right upper quadrant ultrasound dated 01/28/2017 FINDINGS: Lower chest: Lung bases are clear. Hepatobiliary: Mild hepatic steatosis with areas of focal fatty sparing. No suspicious/enhancing hepatic lesion is seen. Layering tiny gallstones with gallbladder sludge (series 6/image 20), with associated gallbladder wall thickening. No pericholecystic fluid or inflammatory changes. Fundal adenomyomatosis (series 1502/image 60), benign. No intrahepatic duct dilatation. Common duct measures 7 mm, at the upper limits of normal. However, three mid/distal CBD stones are suspected (series 7/images 18, 25, and 27), measuring up to 5 mm. Associated filling defect in the distal CBD on coronal MRCP (series 9/ image 35). Pancreas:  Within normal limits. Spleen:  Within normal limits. Adrenals/Urinary Tract: Mild thickening of the left adrenal gland. Right adrenal glands within normal limits. Kidneys are within normal limits.  No hydronephrosis. Stomach/Bowel: Stomach is within normal limits. Visualized bowel is  grossly unremarkable. Vascular/Lymphatic:  No evidence of abdominal aortic aneurysm. No suspicious abdominal lymphadenopathy. Other:  No abdominal ascites. Musculoskeletal: No focal osseous lesions. IMPRESSION: Cholelithiasis, without associated findings to suggest acute cholecystitis. Suspected choledocholithiasis with three mid/distal CBD stones measuring up to 5 mm. No intrahepatic or extrahepatic ductal dilatation. Consider ERCP as clinically warranted. Mild hepatic steatosis with areas of focal fatty sparing. Electronically Signed   By: Charline Bills M.D.   On: 01/29/2017 07:17    Microbiology: Recent Results (from the past  240 hour(s))  Culture, blood (Routine X 2) w Reflex to ID Panel     Status: None (Preliminary result)   Collection Time: 01/28/17  4:09 PM  Result Value Ref Range Status   Specimen Description BLOOD BLOOD RIGHT HAND  Final   Special Requests   Final    BOTTLES DRAWN AEROBIC AND ANAEROBIC Blood Culture adequate volume   Culture NO GROWTH 4 DAYS  Final   Report Status PENDING  Incomplete  Culture, Urine     Status: None   Collection Time: 01/28/17  4:20 PM  Result Value Ref Range Status   Specimen Description URINE, RANDOM  Final   Special Requests NONE  Final   Culture NO GROWTH  Final   Report Status 01/29/2017 FINAL  Final  Culture, blood (Routine X 2) w Reflex to ID Panel     Status: None (Preliminary result)   Collection Time: 01/28/17  4:40 PM  Result Value Ref Range Status   Specimen Description BLOOD LEFT ANTECUBITAL  Final   Special Requests   Final    BOTTLES DRAWN AEROBIC AND ANAEROBIC Blood Culture results may not be optimal due to an excessive volume of blood received in culture bottles   Culture NO GROWTH 4 DAYS  Final   Report Status PENDING  Incomplete  Surgical pcr screen     Status: None   Collection Time: 01/30/17  4:34 PM  Result Value Ref Range Status   MRSA, PCR NEGATIVE NEGATIVE Final   Staphylococcus aureus NEGATIVE NEGATIVE Final     Comment: (NOTE) The Xpert SA Assay (FDA approved for NASAL specimens in patients 10022 years of age and older), is one component of a comprehensive surveillance program. It is not intended to diagnose infection nor to guide or monitor treatment.      Labs: Basic Metabolic Panel: Recent Labs  Lab 01/28/17 1113 01/29/17 0345 01/30/17 0432 01/31/17 0421 02/01/17 0418  NA 138 136 139 138 139  K 3.8 3.6 3.6 3.6 3.8  CL 104 104 108 106 107  CO2 24 24 23 23 25   GLUCOSE 139* 150* 123* 135* 118*  BUN 13 14 8 6 9   CREATININE 0.98 1.02* 0.85 0.85 0.80  CALCIUM 9.2 8.3* 8.4* 8.5* 8.2*  MG  --   --  1.6*  --   --   PHOS  --   --  2.8  --   --    Liver Function Tests: Recent Labs  Lab 01/28/17 1113 01/29/17 0345 01/30/17 0432 01/31/17 0421 02/01/17 0418  AST 195* 121* 91* 87* 67*  ALT 206* 171* 146* 143* 121*  ALKPHOS 118 103 110 105 83  BILITOT 6.7* 7.9* 5.3* 3.1* 1.9*  PROT 7.5 6.2* 6.3* 6.3* 5.7*  ALBUMIN 3.5 2.8* 2.8* 2.9* 2.6*   Recent Labs  Lab 01/28/17 1113  LIPASE 31   No results for input(s): AMMONIA in the last 168 hours. CBC: Recent Labs  Lab 01/28/17 1113 01/29/17 0345 01/30/17 0432 01/31/17 0421  WBC 16.5* 9.3 6.8 9.7  NEUTROABS  --   --  3.1  --   HGB 16.2* 13.6 13.6 13.7  HCT 47.6* 41.4 41.0 41.7  MCV 88.5 89.2 89.1 89.1  PLT 258 218 224 252   Cardiac Enzymes: No results for input(s): CKTOTAL, CKMB, CKMBINDEX, TROPONINI in the last 168 hours. BNP: BNP (last 3 results) No results for input(s): BNP in the last 8760 hours.  ProBNP (last 3 results) No results for input(s): PROBNP in the last 8760  hours.  CBG: Recent Labs  Lab 01/31/17 1956 02/01/17 0002 02/01/17 0503 02/01/17 0810 02/01/17 1156  GLUCAP 158* 125* 123* 94 175*    Principal Problem:   Choledocholithiasis Active Problems:   RUQ abdominal pain   Diabetes mellitus   Hypertension   SIRS (systemic inflammatory response syndrome) (HCC)   Abnormal urinalysis   Hepatic  steatosis   Time coordinating discharge: 35 minutes  Signed:  Brendia Sacks, MD [facility] 02/01/2017, 3:18 PM

## 2017-02-01 NOTE — Progress Notes (Signed)
Discharge home. Home discharge instruction given, no question verbalized. 

## 2017-02-01 NOTE — Progress Notes (Signed)
[facility] [facility] Progress Note  1 Day Post-Op   Assessment/Plan Principal Problem:   Choledocholithiasis Active Problems:   RUQ abdominal pain   Diabetes mellitus   Hypertension   SIRS (systemic inflammatory response syndrome) (HCC)   Abnormal urinalysis   Hepatic steatosis  Choledocholithiasis  Abnormal LFT's Hyperbilirubinemia  - RUQ U/S without cholelithiasis or evidence of cholecystitis but study poor 2/2 to habitus - MRCP 11/30 showed cholelithiasis and suspected choledocholithiasis - ERCP 12/01  - S/P lap chole, Dr. Dwain SarnaWakefield, 12/2 - LFT's and Tbili trending down  FEN: soft diet VTE: SCD's, lovenox ID: Zosyn 11/29>> Foley: none Follow up: 2 weeks CCS   DISPO: pt is ready for discharge from a surgical standpoint if she tolerates her diet today. No antibiotics needed at discharge. Work note given to pt.   LOS: 4 days    Subjective: CC: abdominal soreness  Pt states pain is controlled. She tolerated clears. No nausea or vomiting. Having flatus. No BM. Husband at beside.   Objective: Vital signs in last 24 hours: Temp:  [97.2 F (36.2 C)-98.7 F (37.1 C)] 98.4 F (36.9 C) (12/03 0500) Pulse Rate:  [63-71] 63 (12/03 0500) Resp:  [12-18] 18 (12/03 0500) BP: (114-157)/(59-76) 133/66 (12/03 0500) SpO2:  [91 %-97 %] 94 % (12/03 0500) Weight:  [318 lb (144.2 kg)] 318 lb (144.2 kg) (12/03 0500) Last BM Date: 01/30/17  Intake/Output from previous day: 12/02 0701 - 12/03 0700 In: 2224.7 [P.O.:240; I.V.:1884.7; IV Piggyback:100] Out: 2625 [Urine:2600; Blood:25] Intake/Output this shift: No intake/output data recorded.  PE: Gen:  Alert, NAD, pleasant, cooperative Card:  RRR, no M/G/R heard Pulm:  Rate and effort normal Abd: Soft, obese, not distended, +BS, no HSM, incisions with glue and steri-strips intact, appropriately tender, no guarding Skin: no rashes noted, warm and dry  Anti-infectives: Anti-infectives (From admission, onward)   Start     Dose/Rate Route Frequency Ordered Stop   01/28/17 2000  piperacillin-tazobactam (ZOSYN) IVPB 3.375 g     3.375 g 12.5 mL/hr over 240 Minutes Intravenous Every 8 hours 01/28/17 1510     01/28/17 1230  piperacillin-tazobactam (ZOSYN) IVPB 3.375 g     3.375 g 100 mL/hr over 30 Minutes Intravenous  Once 01/28/17 1226 01/28/17 1452      Lab Results:  Recent Labs    01/30/17 0432 01/31/17 0421  WBC 6.8 9.7  HGB 13.6 13.7  HCT 41.0 41.7  PLT 224 252   BMET Recent Labs    01/31/17 0421 02/01/17 0418  NA 138 139  K 3.6 3.8  CL 106 107  CO2 23 25  GLUCOSE 135* 118*  BUN 6 9  CREATININE 0.85 0.80  CALCIUM 8.5* 8.2*   PT/INR No results for input(s): LABPROT, INR in the last 72 hours. CMP     Component Value Date/Time   NA 139 02/01/2017 0418   K 3.8 02/01/2017 0418   CL 107 02/01/2017 0418   CO2 25 02/01/2017 0418   GLUCOSE 118 (H) 02/01/2017 0418   BUN 9 02/01/2017 0418   CREATININE 0.80 02/01/2017 0418   CREATININE 0.79 07/11/2015 0840   CALCIUM 8.2 (L) 02/01/2017 0418   PROT 5.7 (L) 02/01/2017 0418   ALBUMIN 2.6 (L) 02/01/2017 0418   AST 67 (H) 02/01/2017 0418   ALT 121 (H) 02/01/2017 0418   ALKPHOS 83 02/01/2017 0418   BILITOT 1.9 (H) 02/01/2017 0418   GFRNONAA >60 02/01/2017 0418   GFRNONAA 82 07/11/2015 0840   GFRAA >60 02/01/2017 [id number]  GFRAA >89 07/11/2015 0840   Lipase     Component Value Date/Time   LIPASE 31 01/28/2017 1113    Studies/Results: Dg Ercp With Sphincterotomy  Result Date: 01/30/2017 CLINICAL DATA:  Suggestion of choledocholithiasis by MRCP. EXAM: ERCP TECHNIQUE: Multiple spot images obtained with the fluoroscopic device and submitted for interpretation post-procedure. COMPARISON:  MRCP study on 01/29/2017 FINDINGS: Images submitted during the ERCP procedure demonstrate subtle filling defects within a nondilated common bile duct. Balloon extraction was performed with completion balloon occlusion cholangiogram showing no further  filling defects. IMPRESSION: Filling defects in the common bile duct consistent with choledocholithiasis. After balloon sweep extraction, no further filling defects are identified. These images were submitted for radiologic interpretation only. Please see the procedural report for the amount of contrast and the fluoroscopy time utilized. Electronically Signed   By: Irish LackGlenn  Yamagata M.D.   On: 01/30/2017 09:25      Jerre SimonJessica L Focht , [facility] 02/01/2017, 7:52 AM Pager: [phone] Consults: [phone] Mon-Fri 7:00 am-4:30 pm Sat-Sun 7:00 am-11:30 am

## 2017-02-02 LAB — CULTURE, BLOOD (ROUTINE X 2)
CULTURE: NO GROWTH
Culture: NO GROWTH
SPECIAL REQUESTS: ADEQUATE

## 2017-02-24 ENCOUNTER — Other Ambulatory Visit: Payer: Self-pay | Admitting: Gastroenterology

## 2017-02-24 DIAGNOSIS — R1011 Right upper quadrant pain: Secondary | ICD-10-CM

## 2017-02-26 ENCOUNTER — Ambulatory Visit
Admission: RE | Admit: 2017-02-26 | Discharge: 2017-02-26 | Disposition: A | Discharge status: Home / Self Care | Payer: BC Managed Care – PPO | Source: Ambulatory Visit | Attending: Gastroenterology | Admitting: Gastroenterology

## 2017-02-26 DIAGNOSIS — R1011 Right upper quadrant pain: Secondary | ICD-10-CM

## 2017-03-30 ENCOUNTER — Other Ambulatory Visit: Payer: Self-pay

## 2017-03-30 ENCOUNTER — Encounter (HOSPITAL_COMMUNITY): Payer: Self-pay | Admitting: Emergency Medicine

## 2017-03-30 ENCOUNTER — Ambulatory Visit (HOSPITAL_COMMUNITY)
Admission: EM | Admit: 2017-03-30 | Discharge: 2017-03-30 | Disposition: A | Discharge status: Home / Self Care | Payer: BC Managed Care – PPO | Attending: Family Medicine | Admitting: Family Medicine

## 2017-03-30 DIAGNOSIS — B349 Viral infection, unspecified: Secondary | ICD-10-CM | POA: Diagnosis not present

## 2017-03-30 MED ORDER — FLUTICASONE PROPIONATE 50 MCG/ACT NA SUSP: 2.0000 | Freq: Every day | NASAL | 0 refills | Status: AC

## 2017-03-30 MED ORDER — MELOXICAM 7.5 MG PO TABS: 7.5000 mg | ORAL_TABLET | Freq: Every day | ORAL | 0 refills | Status: DC

## 2017-03-30 MED ORDER — IPRATROPIUM BROMIDE 0.06 % NA SOLN: 2.0000 | Freq: Four times a day (QID) | NASAL | 0 refills | Status: AC

## 2017-03-30 MED ORDER — BENZONATATE 100 MG PO CAPS: 100.0000 mg | ORAL_CAPSULE | Freq: Three times a day (TID) | ORAL | 0 refills | Status: AC

## 2017-03-30 NOTE — ED Triage Notes (Signed)
Onset of symptoms started yesterday.  Patient complains of bad headache, sinus drainage, and feels bad in general.  Patient has a sore throat

## 2017-03-30 NOTE — Discharge Instructions (Signed)
Tessalon for cough. Start atrovent nasal spray, flonase for nasal congestion. You can use over the counter nasal saline rinse such as neti pot for nasal congestion. Keep hydrated, your urine should be clear to pale yellow in color. Tylenol/motrin for fever and pain. Monitor for any worsening of symptoms, chest pain, shortness of breath, wheezing, swelling of the throat, follow up for reevaluation.   For sore throat try using a honey-based tea. Use 3 teaspoons of honey with juice squeezed from half lemon. Place shaved pieces of ginger into 1/2-1 cup of water and warm over stove top. Then mix the ingredients and repeat every 4 hours as needed.

## 2017-03-30 NOTE — ED Provider Notes (Signed)
[facility]    CSN: [id number] Arrival date & time: 03/30/17  1002     History   Chief Complaint Chief Complaint  Patient presents with  . Cough    HPI Eliezer MccoyDebra Rumberger is a 62 y.o. female.   62 year old female with history of DM, HTN comes in for 1 day history of URI symptoms. She has had headache, rhinorrhea, nasal congestion, nonproductive cough, sore throat. States temp 100.2 this morning. Otherwise without fever. Denies shortness of breath, wheezing. Tried cough drops without relief. Former smoker, 25-30 years, around 0.5ppd.       Past Medical History:  Diagnosis Date  . Diabetes mellitus   . Hypertension   . Seasonal allergies     Patient Active Problem List   Diagnosis Date Noted  . RUQ abdominal pain 01/28/2017  . Choledocholithiasis 01/28/2017  . Diabetes mellitus 01/28/2017  . Hypertension 01/28/2017  . SIRS (systemic inflammatory response syndrome) (HCC) 01/28/2017  . Abnormal urinalysis 01/28/2017  . Hepatic steatosis 01/28/2017  . Diabetes mellitus, type 2 (HCC) 05/06/2011    Past Surgical History:  Procedure Laterality Date  . ABDOMINAL HYSTERECTOMY    . CHOLECYSTECTOMY N/A 01/31/2017   Procedure: LAPAROSCOPIC CHOLECYSTECTOMY;  Surgeon: Emelia LoronWakefield, Matthew, MD;  [facility];  Service: General;  Laterality: N/A;  . ERCP N/A 01/30/2017   Procedure: ENDOSCOPIC RETROGRADE CHOLANGIOPANCREATOGRAPHY (ERCP);  Surgeon: Vida RiggerMagod, Marc, MD;  [facility];  Service: Endoscopy;  Laterality: N/A;  . KNEE ARTHROSCOPY    . NOSE SURGERY    . TUBAL LIGATION      OB History    No data available       Home Medications    Prior to Admission medications   Medication Sig Start Date End Date Taking? Authorizing Provider  benzonatate (TESSALON) 100 MG capsule Take 1 capsule (100 mg total) by mouth every 8 (eight) hours. 03/30/17   Cathie HoopsYu, Amy V, PA-C  fluticasone (FLONASE) 50 MCG/ACT nasal spray Place 2 sprays into both nostrils daily. 03/30/17   Cathie HoopsYu,  Amy V, PA-C  ipratropium (ATROVENT) 0.06 % nasal spray Place 2 sprays into both nostrils 4 (four) times daily. 03/30/17   Cathie HoopsYu, Amy V, PA-C  lisinopril (PRINIVIL,ZESTRIL) 5 MG tablet Take 1 tablet (5 mg total) by mouth daily. 01/07/16   Copland, Gwenlyn FoundJessica C, MD  meloxicam (MOBIC) 7.5 MG tablet Take 1 tablet (7.5 mg total) by mouth daily. 03/30/17   Cathie HoopsYu, Amy V, PA-C  metFORMIN (GLUCOPHAGE) 1000 MG tablet TAKE 1 TABLET BY MOUTH 2 TIMES DAILY WITH A MEAL. Patient taking differently: TAKE 500 mg TABLET BY MOUTH 2 TIMES DAILY WITH A MEAL. 03/30/15   Emi BelfastGessner, Deborah B, FNP  ondansetron (ZOFRAN) 4 MG tablet Take 1 tablet (4 mg total) by mouth every 8 (eight) hours as needed for nausea or vomiting. 02/01/17   Standley BrookingGoodrich, Daniel P, MD  oxyCODONE 10 MG TABS Take 1 tablet (10 mg total) by mouth every 4 (four) hours as needed for moderate pain. 02/01/17   Jerre SimonFocht, Jessica L, PA    Family History Family History  Problem Relation Age of Onset  . Hypertension Father     Social History Social History   Tobacco Use  . Smoking status: Former Smoker  Substance Use Topics  . Alcohol use: No    Alcohol/week: 0.0 oz  . Drug use: No     Allergies   Erythromycin and Tetracyclines & related   Review of Systems Review of Systems  Reason unable to perform  ROS: See HPI as above.     Physical Exam Triage Vital Signs ED Triage Vitals  Enc Vitals Group     BP 03/30/17 1023 (!) 146/62     Pulse Rate 03/30/17 1023 94     Resp 03/30/17 1023 (!) 22     Temp 03/30/17 1023 99.4 F (37.4 C)     Temp Source 03/30/17 1023 Oral     SpO2 03/30/17 1023 95 %     Weight --      Height --      Head Circumference --      Peak Flow --      Pain Score 03/30/17 1020 6     Pain Loc --      Pain Edu? --      Excl. in GC? --    No data found.  Updated Vital Signs BP (!) 146/62 (BP Location: Left Arm)   Pulse 94   Temp 99.4 F (37.4 C) (Oral)   Resp (!) 22   SpO2 95%   Physical Exam  Constitutional: She is oriented  to person, place, and time. She appears well-developed and well-nourished. No distress.  HENT:  Head: Normocephalic and atraumatic.  Right Ear: External ear and ear canal normal.  Left Ear: External ear and ear canal normal. Tympanic membrane is erythematous. Tympanic membrane is not bulging.  Nose: Mucosal edema and rhinorrhea present. Right sinus exhibits maxillary sinus tenderness. Right sinus exhibits no frontal sinus tenderness. Left sinus exhibits maxillary sinus tenderness. Left sinus exhibits no frontal sinus tenderness.  Mouth/Throat: Uvula is midline and mucous membranes are normal. Posterior oropharyngeal erythema present. No tonsillar exudate.  Right ear canal with cerumen impaction, TM not visible  Eyes: Conjunctivae are normal. Pupils are equal, round, and reactive to light.  Neck: Normal range of motion. Neck supple.  Cardiovascular: Normal rate, regular rhythm and normal heart sounds. Exam reveals no gallop and no friction rub.  No murmur heard. Pulmonary/Chest: Effort normal and breath sounds normal. She has no decreased breath sounds. She has no wheezes. She has no rhonchi. She has no rales.  Lymphadenopathy:    She has no cervical adenopathy.  Neurological: She is alert and oriented to person, place, and time.  Skin: Skin is warm and dry.  Psychiatric: She has a normal mood and affect. Her behavior is normal. Judgment normal.     UC Treatments / Results  Labs (all labs ordered are listed, but only abnormal results are displayed) Labs Reviewed - No data to display  Lab Results  Component Value Date   HGBA1C 6.5 (H) 01/28/2017     EKG  EKG Interpretation None       Radiology No results found.  Procedures Procedures (including critical care time)  Medications Ordered in UC Medications - No data to display   Initial Impression / Assessment and Plan / UC Course  I have reviewed the triage vital signs and the nursing notes.  Pertinent labs & imaging  results that were available during my care of the patient were reviewed by me and considered in my medical decision making (see chart for details).    Discussed with patient history and exam most consistent with viral URI. Symptomatic treatment as needed. Push fluids. Return precautions given.   Final Clinical Impressions(s) / UC Diagnoses   Final diagnoses:  Viral illness    ED Discharge Orders        Ordered    meloxicam (MOBIC) 7.5 MG tablet  Daily  03/30/17 1043    fluticasone (FLONASE) 50 MCG/ACT nasal spray  Daily     03/30/17 1043    ipratropium (ATROVENT) 0.06 % nasal spray  4 times daily     03/30/17 1043    benzonatate (TESSALON) 100 MG capsule  Every 8 hours     03/30/17 1044       Belinda Fisher, PA-C 03/30/17 1048

## 2017-03-31 ENCOUNTER — Telehealth (HOSPITAL_COMMUNITY): Payer: Self-pay | Admitting: Emergency Medicine

## 2017-03-31 NOTE — Telephone Encounter (Signed)
Pt calling to ask if NyQuil will have any effects on the medications she was prescribed yesterday.   Spoke with Dr. Milus GlazierLauenstein and he stated it would not have an adverse effect, but he suggested a Robitussin as a better option.  Called pt back and gave her the information.  Pt stated understanding.

## 2017-04-13 ENCOUNTER — Encounter (HOSPITAL_COMMUNITY): Payer: Self-pay | Admitting: Family Medicine

## 2017-04-13 ENCOUNTER — Ambulatory Visit (HOSPITAL_COMMUNITY)
Admission: EM | Admit: 2017-04-13 | Discharge: 2017-04-13 | Disposition: A | Discharge status: Home / Self Care | Payer: BC Managed Care – PPO | Attending: Family Medicine | Admitting: Family Medicine

## 2017-04-13 DIAGNOSIS — J40 Bronchitis, not specified as acute or chronic: Secondary | ICD-10-CM

## 2017-04-13 MED ORDER — HYDROCOD POLST-CPM POLST ER 10-8 MG/5ML PO SUER: 5.0000 mL | Freq: Every evening | ORAL | 0 refills | Status: AC | PRN

## 2017-04-13 MED ORDER — PREDNISONE 20 MG PO TABS: 40.0000 mg | ORAL_TABLET | Freq: Every day | ORAL | 0 refills | Status: AC

## 2017-04-13 MED ORDER — CEFDINIR 300 MG PO CAPS: 300.0000 mg | ORAL_CAPSULE | Freq: Two times a day (BID) | ORAL | 0 refills | Status: AC

## 2017-04-13 NOTE — Discharge Instructions (Signed)
Start omnicef and prednisone as directed for bronchitis. Tussionex for cough at night. Continue treatment for nasal congestion.  You can use over the counter nasal saline rinse such as neti pot for nasal congestion. Keep hydrated, your urine should be clear to pale yellow in color. Tylenol/motrin for fever and pain. Monitor for any worsening of symptoms, chest pain, shortness of breath, wheezing, swelling of the throat, follow up for reevaluation.

## 2017-04-13 NOTE — ED Provider Notes (Signed)
[facility]    CSN: [id number] Arrival date & time: 04/13/17  1442     History   Chief Complaint Chief Complaint  Patient presents with  . Cough    HPI Olivia Middleton is a 62 y.o. female.   [id number] year old female comes in for returning URI symptoms 4 days ago.  States she had similar symptoms 2 weeks ago, and was getting better, but did not completely resolve. Now with worsening productive cough and congestion since bringing her mother to the hospital 4 days ago. Denies shortness of breath. Experiences chest soreness with cough. Taking otc medication without relief. Former smoker, about 15 pack year history.       Past Medical History:  Diagnosis Date  . Diabetes mellitus   . Hypertension   . Seasonal allergies     Patient Active Problem List   Diagnosis Date Noted  . RUQ abdominal pain 01/28/2017  . Choledocholithiasis 01/28/2017  . Diabetes mellitus 01/28/2017  . Hypertension 01/28/2017  . SIRS (systemic inflammatory response syndrome) (HCC) 01/28/2017  . Abnormal urinalysis 01/28/2017  . Hepatic steatosis 01/28/2017  . Diabetes mellitus, type 2 (HCC) 05/06/2011    Past Surgical History:  Procedure Laterality Date  . ABDOMINAL HYSTERECTOMY    . CHOLECYSTECTOMY N/A 01/31/2017   Procedure: LAPAROSCOPIC CHOLECYSTECTOMY;  Surgeon: Emelia LoronWakefield, Matthew, MD;  [facility];  Service: General;  Laterality: N/A;  . ERCP N/A 01/30/2017   Procedure: ENDOSCOPIC RETROGRADE CHOLANGIOPANCREATOGRAPHY (ERCP);  Surgeon: Vida RiggerMagod, Marc, MD;  [facility];  Service: Endoscopy;  Laterality: N/A;  . KNEE ARTHROSCOPY    . NOSE SURGERY    . TUBAL LIGATION      OB History    No data available       Home Medications    Prior to Admission medications   Medication Sig Start Date End Date Taking? Authorizing Provider  benzonatate (TESSALON) 100 MG capsule Take 1 capsule (100 mg total) by mouth every 8 (eight) hours. 03/30/17   Cathie HoopsYu, Verbie Babic V, PA-C  cefdinir (OMNICEF) 300  MG capsule Take 1 capsule (300 mg total) by mouth 2 (two) times daily for 7 days. 04/13/17 04/20/17  Belinda FisherYu, Sandara Tyree V, PA-C  chlorpheniramine-HYDROcodone (TUSSIONEX PENNKINETIC ER) 10-8 MG/5ML SUER Take 5 mLs by mouth at bedtime as needed for cough. 04/13/17   Cathie HoopsYu, Aujanae Mccullum V, PA-C  fluticasone (FLONASE) 50 MCG/ACT nasal spray Place 2 sprays into both nostrils daily. 03/30/17   Cathie HoopsYu, Geniva Lohnes V, PA-C  ipratropium (ATROVENT) 0.06 % nasal spray Place 2 sprays into both nostrils 4 (four) times daily. 03/30/17   Cathie HoopsYu, Oliviya Gilkison V, PA-C  lisinopril (PRINIVIL,ZESTRIL) 5 MG tablet Take 1 tablet (5 mg total) by mouth daily. 01/07/16   Copland, Gwenlyn FoundJessica C, MD  meloxicam (MOBIC) 7.5 MG tablet Take 1 tablet (7.5 mg total) by mouth daily. 03/30/17   Cathie HoopsYu, Apostolos Blagg V, PA-C  metFORMIN (GLUCOPHAGE) 1000 MG tablet TAKE 1 TABLET BY MOUTH 2 TIMES DAILY WITH A MEAL. Patient taking differently: TAKE 500 mg TABLET BY MOUTH 2 TIMES DAILY WITH A MEAL. 03/30/15   Emi BelfastGessner, Deborah B, FNP  ondansetron (ZOFRAN) 4 MG tablet Take 1 tablet (4 mg total) by mouth every 8 (eight) hours as needed for nausea or vomiting. 02/01/17   Standley BrookingGoodrich, Daniel P, MD  oxyCODONE 10 MG TABS Take 1 tablet (10 mg total) by mouth every 4 (four) hours as needed for moderate pain. 02/01/17   Focht, Joyce CopaJessica L, PA  predniSONE (DELTASONE) 20 MG tablet Take 2 tablets (40 mg total)  by mouth daily for 5 days. 04/13/17 04/18/17  Belinda Fisher, PA-C    Family History Family History  Problem Relation Age of Onset  . Hypertension Father     Social History Social History   Tobacco Use  . Smoking status: Former Smoker  Substance Use Topics  . Alcohol use: No    Alcohol/week: 0.0 oz  . Drug use: No     Allergies   Erythromycin and Tetracyclines & related   Review of Systems Review of Systems  Reason unable to perform ROS: See HPI as above.     Physical Exam Triage Vital Signs ED Triage Vitals  Enc Vitals Group     BP 04/13/17 1506 (!) 166/84     Pulse Rate 04/13/17 1506 88     Resp  04/13/17 1506 20     Temp 04/13/17 1506 100.2 F (37.9 C)     Temp Source 04/13/17 1506 Oral     SpO2 04/13/17 1506 97 %     Weight --      Height --      Head Circumference --      Peak Flow --      Pain Score 04/13/17 1505 5     Pain Loc --      Pain Edu? --      Excl. in GC? --    No data found.  Updated Vital Signs BP (!) 166/84   Pulse 88   Temp 100.2 F (37.9 C) (Oral)   Resp 20   SpO2 97%   Physical Exam  Constitutional: She is oriented to person, place, and time. She appears well-developed and well-nourished. No distress.  HENT:  Head: Normocephalic and atraumatic.  Right Ear: Tympanic membrane, external ear and ear canal normal. Tympanic membrane is not erythematous and not bulging.  Left Ear: Tympanic membrane, external ear and ear canal normal. Tympanic membrane is not erythematous and not bulging.  Nose: Mucosal edema and rhinorrhea present. Right sinus exhibits maxillary sinus tenderness. Right sinus exhibits no frontal sinus tenderness. Left sinus exhibits maxillary sinus tenderness. Left sinus exhibits no frontal sinus tenderness.  Mouth/Throat: Uvula is midline, oropharynx is clear and moist and mucous membranes are normal.  Eyes: Conjunctivae are normal. Pupils are equal, round, and reactive to light.  Neck: Normal range of motion. Neck supple.  Cardiovascular: Normal rate, regular rhythm and normal heart sounds. Exam reveals no gallop and no friction rub.  No murmur heard. Pulmonary/Chest: Effort normal and breath sounds normal. No stridor. She has no decreased breath sounds. She has no wheezes. She has no rhonchi. She has no rales.  Lymphadenopathy:    She has no cervical adenopathy.  Neurological: She is alert and oriented to person, place, and time.  Skin: Skin is warm and dry.  Psychiatric: She has a normal mood and affect. Her behavior is normal. Judgment normal.   UC Treatments / Results  Labs (all labs ordered are listed, but only abnormal results  are displayed) Labs Reviewed - No data to display  EKG  EKG Interpretation None       Radiology No results found.  Procedures Procedures (including critical care time)  Medications Ordered in UC Medications - No data to display   Initial Impression / Assessment and Plan / UC Course  I have reviewed the triage vital signs and the nursing notes.  Pertinent labs & imaging results that were available during my care of the patient were reviewed by me and considered in  my medical decision making (see chart for details).    Given history and exam, will treat for COPD exacerbation. Patient with allergies to macrolides and tetracyclines, discussed case with Dr Tracie Harrier, who suggested omnicef. Start omnicef and prednisone as directed. Other symptomatic treatment provided. Return precautions given.   Final Clinical Impressions(s) / UC Diagnoses   Final diagnoses:  Bronchitis    ED Discharge Orders        Ordered    cefdinir (OMNICEF) 300 MG capsule  2 times daily     04/13/17 1552    predniSONE (DELTASONE) 20 MG tablet  Daily     04/13/17 1552    chlorpheniramine-HYDROcodone (TUSSIONEX PENNKINETIC ER) 10-8 MG/5ML SUER  At bedtime PRN     04/13/17 1552        Belinda Fisher, PA-C 04/13/17 1603

## 2017-04-13 NOTE — ED Triage Notes (Signed)
Pt here for cough, congestion. reports that she was seen for similar illness 2 weeks ago and was getting better. This has been going on since Friday . Taking Nyquil without relief.

## 2017-04-27 ENCOUNTER — Other Ambulatory Visit: Payer: Self-pay | Admitting: Family Medicine

## 2017-04-27 DIAGNOSIS — Z1231 Encounter for screening mammogram for malignant neoplasm of breast: Secondary | ICD-10-CM

## 2017-05-24 ENCOUNTER — Ambulatory Visit
Admission: RE | Admit: 2017-05-24 | Discharge: 2017-05-24 | Disposition: A | Discharge status: Home / Self Care | Payer: BC Managed Care – PPO | Source: Ambulatory Visit | Attending: Family Medicine | Admitting: Family Medicine

## 2017-05-24 DIAGNOSIS — Z1231 Encounter for screening mammogram for malignant neoplasm of breast: Secondary | ICD-10-CM

## 2017-12-20 ENCOUNTER — Other Ambulatory Visit: Payer: Self-pay

## 2017-12-20 ENCOUNTER — Emergency Department (HOSPITAL_BASED_OUTPATIENT_CLINIC_OR_DEPARTMENT_OTHER)
Admission: EM | Admit: 2017-12-20 | Discharge: 2017-12-20 | Disposition: A | Discharge status: Home / Self Care | Payer: BC Managed Care – PPO | Attending: Emergency Medicine | Admitting: Emergency Medicine

## 2017-12-20 ENCOUNTER — Emergency Department (HOSPITAL_BASED_OUTPATIENT_CLINIC_OR_DEPARTMENT_OTHER): Payer: BC Managed Care – PPO

## 2017-12-20 ENCOUNTER — Encounter (HOSPITAL_BASED_OUTPATIENT_CLINIC_OR_DEPARTMENT_OTHER): Payer: Self-pay | Admitting: *Deleted

## 2017-12-20 DIAGNOSIS — I2510 Atherosclerotic heart disease of native coronary artery without angina pectoris: Secondary | ICD-10-CM | POA: Insufficient documentation

## 2017-12-20 DIAGNOSIS — Y939 Activity, unspecified: Secondary | ICD-10-CM | POA: Insufficient documentation

## 2017-12-20 DIAGNOSIS — Z7984 Long term (current) use of oral hypoglycemic drugs: Secondary | ICD-10-CM | POA: Diagnosis not present

## 2017-12-20 DIAGNOSIS — Y999 Unspecified external cause status: Secondary | ICD-10-CM | POA: Diagnosis not present

## 2017-12-20 DIAGNOSIS — I10 Essential (primary) hypertension: Secondary | ICD-10-CM | POA: Insufficient documentation

## 2017-12-20 DIAGNOSIS — S20212A Contusion of left front wall of thorax, initial encounter: Secondary | ICD-10-CM | POA: Diagnosis not present

## 2017-12-20 DIAGNOSIS — D582 Other hemoglobinopathies: Secondary | ICD-10-CM | POA: Diagnosis not present

## 2017-12-20 DIAGNOSIS — I2584 Coronary atherosclerosis due to calcified coronary lesion: Secondary | ICD-10-CM | POA: Diagnosis not present

## 2017-12-20 DIAGNOSIS — E119 Type 2 diabetes mellitus without complications: Secondary | ICD-10-CM | POA: Insufficient documentation

## 2017-12-20 DIAGNOSIS — E041 Nontoxic single thyroid nodule: Secondary | ICD-10-CM | POA: Diagnosis not present

## 2017-12-20 DIAGNOSIS — Z87891 Personal history of nicotine dependence: Secondary | ICD-10-CM | POA: Diagnosis not present

## 2017-12-20 DIAGNOSIS — Y92410 Unspecified street and highway as the place of occurrence of the external cause: Secondary | ICD-10-CM | POA: Diagnosis not present

## 2017-12-20 DIAGNOSIS — S299XXA Unspecified injury of thorax, initial encounter: Secondary | ICD-10-CM | POA: Diagnosis present

## 2017-12-20 LAB — CBC WITH DIFFERENTIAL/PLATELET
ABS IMMATURE GRANULOCYTES: 0.02 10*3/uL (ref 0.00–0.07)
BASOS ABS: 0.1 10*3/uL (ref 0.0–0.1)
Basophils Relative: 1 %
Eosinophils Absolute: 0.2 10*3/uL (ref 0.0–0.5)
Eosinophils Relative: 2 %
HCT: 48.8 % — ABNORMAL HIGH (ref 36.0–46.0)
HEMOGLOBIN: 15.9 g/dL — AB (ref 12.0–15.0)
Immature Granulocytes: 0 %
LYMPHS PCT: 36 %
Lymphs Abs: 3.7 10*3/uL (ref 0.7–4.0)
MCH: 29.1 pg (ref 26.0–34.0)
MCHC: 32.6 g/dL (ref 30.0–36.0)
MCV: 89.4 fL (ref 80.0–100.0)
Monocytes Absolute: 1 10*3/uL (ref 0.1–1.0)
Monocytes Relative: 10 %
NEUTROS ABS: 5.2 10*3/uL (ref 1.7–7.7)
NRBC: 0 % (ref 0.0–0.2)
Neutrophils Relative %: 51 %
Platelets: 277 10*3/uL (ref 150–400)
RBC: 5.46 MIL/uL — AB (ref 3.87–5.11)
RDW: 12.9 % (ref 11.5–15.5)
WBC: 10.1 10*3/uL (ref 4.0–10.5)

## 2017-12-20 LAB — BASIC METABOLIC PANEL
Anion gap: 12 (ref 5–15)
BUN: 15 mg/dL (ref 8–23)
CHLORIDE: 99 mmol/L (ref 98–111)
CO2: 26 mmol/L (ref 22–32)
Calcium: 9.6 mg/dL (ref 8.9–10.3)
Creatinine, Ser: 0.81 mg/dL (ref 0.44–1.00)
GFR calc Af Amer: >60 mL/min (ref >60)
GFR calc non Af Amer: >60 mL/min (ref >60)
GLUCOSE: 117 mg/dL — AB (ref 70–99)
Potassium: 3.8 mmol/L (ref 3.5–5.1)
Sodium: 137 mmol/L (ref 135–145)

## 2017-12-20 MED ORDER — METHOCARBAMOL 500 MG PO TABS: 500.0000 mg | ORAL_TABLET | Freq: Two times a day (BID) | ORAL | 0 refills | Status: AC

## 2017-12-20 MED ORDER — ACETAMINOPHEN 500 MG PO TABS
1000.0000 mg | ORAL_TABLET | Freq: Once | ORAL | Status: AC
  Administered 2017-12-20: 1000 mg via ORAL
  Filled 2017-12-20: qty 2

## 2017-12-20 MED ORDER — IOPAMIDOL (ISOVUE-300) INJECTION 61%
100.0000 mL | Freq: Once | INTRAVENOUS | Status: AC | PRN
  Administered 2017-12-20: 100 mL via INTRAVENOUS

## 2017-12-20 MED FILL — METHOCARBAMOL 500 MG TABLET: 500 | 5 days supply | Qty: 10 | Fill #0
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
## 2020-01-22 ENCOUNTER — Other Ambulatory Visit: Payer: Self-pay | Admitting: Family Medicine

## 2020-01-22 DIAGNOSIS — Z1231 Encounter for screening mammogram for malignant neoplasm of breast: Secondary | ICD-10-CM

## 2020-01-22 DIAGNOSIS — E2839 Other primary ovarian failure: Secondary | ICD-10-CM

## 2020-04-29 ENCOUNTER — Other Ambulatory Visit: Payer: Self-pay | Admitting: Family Medicine

## 2020-04-29 DIAGNOSIS — E2839 Other primary ovarian failure: Secondary | ICD-10-CM

## 2020-05-01 ENCOUNTER — Ambulatory Visit: Payer: BC Managed Care – PPO

## 2020-05-01 ENCOUNTER — Other Ambulatory Visit: Payer: Self-pay

## 2020-05-01 ENCOUNTER — Ambulatory Visit
Admission: RE | Admit: 2020-05-01 | Discharge: 2020-05-01 | Disposition: A | Discharge status: Home / Self Care | Payer: BC Managed Care – PPO | Source: Ambulatory Visit | Attending: Family Medicine | Admitting: Family Medicine

## 2020-05-01 ENCOUNTER — Other Ambulatory Visit: Payer: BC Managed Care – PPO

## 2020-05-01 DIAGNOSIS — E2839 Other primary ovarian failure: Secondary | ICD-10-CM

## 2020-05-02 ENCOUNTER — Ambulatory Visit
Admission: RE | Admit: 2020-05-02 | Discharge: 2020-05-02 | Disposition: A | Discharge status: Home / Self Care | Payer: BC Managed Care – PPO | Source: Ambulatory Visit | Attending: Family Medicine | Admitting: Family Medicine

## 2020-05-02 DIAGNOSIS — Z1231 Encounter for screening mammogram for malignant neoplasm of breast: Secondary | ICD-10-CM

## 2020-06-19 ENCOUNTER — Ambulatory Visit: Payer: Self-pay

## 2020-10-01 ENCOUNTER — Other Ambulatory Visit: Payer: Self-pay

## 2020-10-01 ENCOUNTER — Other Ambulatory Visit: Payer: Self-pay | Admitting: Occupational Medicine

## 2020-10-01 ENCOUNTER — Ambulatory Visit: Payer: Self-pay

## 2020-10-01 DIAGNOSIS — M25531 Pain in right wrist: Secondary | ICD-10-CM

## 2022-05-07 ENCOUNTER — Ambulatory Visit
Admission: EM | Admit: 2022-05-07 | Discharge: 2022-05-07 | Disposition: A | Discharge status: Home / Self Care | Payer: BC Managed Care – PPO

## 2022-05-07 DIAGNOSIS — R197 Diarrhea, unspecified: Secondary | ICD-10-CM

## 2022-05-07 DIAGNOSIS — E860 Dehydration: Secondary | ICD-10-CM

## 2022-05-07 MED ORDER — CIPROFLOXACIN HCL 500 MG PO TABS: 500.0000 mg | ORAL_TABLET | Freq: Two times a day (BID) | ORAL | 0 refills | Status: AC

## 2022-05-07 MED ORDER — SODIUM CHLORIDE 0.9 % IV BOLUS
1000.0000 mL | Freq: Once | INTRAVENOUS | Status: AC
  Administered 2022-05-07: 1000 mL via INTRAVENOUS

## 2022-05-07 NOTE — Discharge Instructions (Addendum)
Use imodium as directed on packaging.  Take antibiotic as directed.  Push fluids.  Go to ED for evaluation if symptoms continue or worsening, or if you develop new concerning symptoms.

## 2022-05-07 NOTE — ED Provider Notes (Signed)
[facility]    CSN: [id number] Arrival date & time: 05/07/22  1757      History   Chief Complaint Chief Complaint  Patient presents with   Abdominal Pain   Diarrhea    HPI Olivia Middleton is a 67 y.o. female.   HPI  Presents with abdominal pain and diarrhea since Monday.  Denies fever.  PMH includes hypertension and DM2.  Past Medical History:  Diagnosis Date   Diabetes mellitus    Hypertension    Seasonal allergies     Patient Active Problem List   Diagnosis Date Noted   RUQ abdominal pain 01/28/2017   Choledocholithiasis 01/28/2017   Diabetes mellitus 01/28/2017   Hypertension 01/28/2017   SIRS (systemic inflammatory response syndrome) ([place]) 01/28/2017   Abnormal urinalysis 01/28/2017   Hepatic steatosis 01/28/2017   Diabetes mellitus, type 2 ([place]) 05/06/2011    Past Surgical History:  Procedure Laterality Date   ABDOMINAL HYSTERECTOMY     CHOLECYSTECTOMY N/A 01/31/2017   Procedure: LAPAROSCOPIC CHOLECYSTECTOMY;  Surgeon: Rolm Bookbinder, MD;  Location: [place];  Service: General;  Laterality: N/A;   ERCP N/A 01/30/2017   Procedure: ENDOSCOPIC RETROGRADE CHOLANGIOPANCREATOGRAPHY (ERCP);  Surgeon: Clarene Essex, MD;  Location: [place];  Service: Endoscopy;  Laterality: N/A;   KNEE ARTHROSCOPY     NOSE SURGERY     TUBAL LIGATION      OB History   No obstetric history on file.      Home Medications    Prior to Admission medications   Medication Sig Start Date End Date Taking? Authorizing Provider  JARDIANCE 25 MG TABS tablet Take 25 mg by mouth daily. 04/16/22  Yes [provider]  benzonatate (TESSALON) 100 MG capsule Take 1 capsule (100 mg total) by mouth every 8 (eight) hours. 03/30/17   Tasia Catchings, Amy V, PA-C  chlorpheniramine-HYDROcodone (TUSSIONEX PENNKINETIC ER) 10-8 MG/5ML SUER Take 5 mLs by mouth at bedtime as needed for cough. 04/13/17   Ok Edwards, PA-C  Dulaglutide (TRULICITY [place]) Trulicity    [provider]  fluticasone  (FLONASE) 50 MCG/ACT nasal spray Place 2 sprays into both nostrils daily. 03/30/17   Tasia Catchings, Amy V, PA-C  ipratropium (ATROVENT) 0.06 % nasal spray Place 2 sprays into both nostrils 4 (four) times daily. 03/30/17   Tasia Catchings, Amy V, PA-C  lisinopril (PRINIVIL,ZESTRIL) 5 MG tablet Take 1 tablet (5 mg total) by mouth daily. 01/07/16   Copland, Gay Filler, MD  meloxicam (MOBIC) 7.5 MG tablet Take 1 tablet (7.5 mg total) by mouth daily. 03/30/17   Tasia Catchings, Amy V, PA-C  metFORMIN (GLUCOPHAGE) 1000 MG tablet TAKE 1 TABLET BY MOUTH 2 TIMES DAILY WITH A MEAL. Patient taking differently: TAKE 500 mg TABLET BY MOUTH 2 TIMES DAILY WITH A MEAL. 03/30/15   Elby Beck, FNP  methocarbamol (ROBAXIN) 500 MG tablet Take 1 tablet (500 mg total) by mouth 2 (two) times daily. 12/20/17   Langston Masker B, PA-C  ondansetron (ZOFRAN) 4 MG tablet Take 1 tablet (4 mg total) by mouth every 8 (eight) hours as needed for nausea or vomiting. 02/01/17   Samuella Cota, MD  oxyCODONE 10 MG TABS Take 1 tablet (10 mg total) by mouth every 4 (four) hours as needed for moderate pain. 02/01/17   Kalman Drape, PA    Family History Family History  Problem Relation Age of Onset   Hypertension Father     Social History Social History   Tobacco Use   Smoking status: Former  Smokeless tobacco: Never  Substance Use Topics   Alcohol use: No    Alcohol/week: 0.0 standard drinks of alcohol   Drug use: No     Allergies   Erythromycin and Tetracyclines & related   Review of Systems Review of Systems   Physical Exam Triage Vital Signs ED Triage Vitals [05/07/22 1813]  Enc Vitals Group     BP      Pulse      Resp      Temp      Temp src      SpO2      Weight      Height      Head Circumference      Peak Flow      Pain Score 0     Pain Loc      Pain Edu?      Excl. in [place]?    No data found.  Updated Vital Signs There were no vitals taken for this visit.  Visual Acuity Right Eye Distance:   Left Eye Distance:    Bilateral Distance:    Right Eye Near:   Left Eye Near:    Bilateral Near:     Physical Exam   UC Treatments / Results  Labs (all labs ordered are listed, but only abnormal results are displayed) Labs Reviewed - No data to display  EKG   Radiology No results found.  Procedures Procedures (including critical care time)  Medications Ordered in UC Medications - No data to display  Initial Impression / Assessment and Plan / UC Course  I have reviewed the triage vital signs and the nursing notes.  Pertinent labs & imaging results that were available during my care of the patient were reviewed by me and considered in my medical decision making (see chart for details).   Patient is afebrile here without recent antipyretics. Satting well on room air. Overall is ill appearing, without respiratory distress.  Bowel sounds are hyperactive.  Some abdominal tenderness generally.  Diarrhea of presumed infectious origin, viral versus bacterial.  Given her comorbidities, concern for passable bacterial involvement and will prescribe Cipro twice daily x 5 days.  Also recommending use of Imodium.  Likely dehydration given her symptoms over the past 5 days and will attempt a bolus of IV fluids.  Otherwise, will recommend discharge to the ED for evaluation and treatment of likely dehydration.  IV access is obtained through her right hand. Fluids started at 18:50. IV fluids continuing at [id number] without complication. Completed at 19:35. She endorses improved symptoms.  Final Clinical Impressions(s) / UC Diagnoses   Final diagnoses:  None   Discharge Instructions   None    ED Prescriptions   None    PDMP not reviewed this encounter.   Rose Phi, [place] 05/07/22 1939

## 2022-05-07 NOTE — ED Triage Notes (Signed)
Patient presents to [facility] for abdominal pain and diarrhea since Monday. States she took one dose of OTC med on Monday. Has not taken any other meds.   Denies fever.

## 2022-09-24 ENCOUNTER — Ambulatory Visit: Payer: BC Managed Care – PPO | Attending: Internal Medicine | Admitting: Internal Medicine

## 2022-09-24 ENCOUNTER — Encounter: Payer: Self-pay | Admitting: Internal Medicine

## 2022-09-24 VITALS — BP 126/66 | HR 74 | Ht 66.0 in | Wt 273.2 lb

## 2022-09-24 DIAGNOSIS — I10 Essential (primary) hypertension: Secondary | ICD-10-CM | POA: Diagnosis not present

## 2022-09-24 DIAGNOSIS — E119 Type 2 diabetes mellitus without complications: Secondary | ICD-10-CM

## 2022-09-24 DIAGNOSIS — E785 Hyperlipidemia, unspecified: Secondary | ICD-10-CM | POA: Diagnosis not present

## 2022-09-24 NOTE — Progress Notes (Signed)
LIPID CLINIC CONSULT NOTE  Chief Complaint:  Manage dyslipidemia  Primary Care Physician: Maurice Small, MD (Inactive)  Primary Cardiologist:  None  HPI:  Olivia Middleton is a 67 y.o. female who is being seen today for the evaluation of dyslipidemia at the request of Pahwani, Kasandra Knudsen, MD. this is a pleasant 67 year old female kindly referred for evaluation management of dyslipidemia.  She reports longstanding history of high cholesterol and cardiovascular risk factors including type 2 diabetes and hypertension.  Unfortunately she has a history of statin intolerance.  She reports having tried numerous statins in the past all of which caused significant myalgias.  But her most recent lipid profile showed total cholesterol 232, triglycerides 125, HDL 47 and LDL 163.  We discussed possible treatment options with her today.  Given her diabetes her target LDL is less than 70 and she will need more than 50% reduction in LDL to reach that target.  PMHx:  Past Medical History:  Diagnosis Date   Diabetes mellitus    Hypertension    Seasonal allergies     Past Surgical History:  Procedure Laterality Date   ABDOMINAL HYSTERECTOMY     CHOLECYSTECTOMY N/A 01/31/2017   Procedure: LAPAROSCOPIC CHOLECYSTECTOMY;  Surgeon: Emelia Loron, MD;  Location: [facility] OR;  Service: General;  Laterality: N/A;   ERCP N/A 01/30/2017   Procedure: ENDOSCOPIC RETROGRADE CHOLANGIOPANCREATOGRAPHY (ERCP);  Surgeon: Vida Rigger, MD;  Location: [facility] ENDOSCOPY;  Service: Endoscopy;  Laterality: N/A;   KNEE ARTHROSCOPY     NOSE SURGERY     TUBAL LIGATION      FAMHx:  Family History  Problem Relation Age of Onset   Hypertension Father     SOCHx:   reports that she has quit smoking. She has never used smokeless tobacco. She reports that she does not drink alcohol and does not use drugs.  ALLERGIES:  Allergies  Allergen Reactions   Erythromycin    Tetracyclines & Related     ROS: Pertinent items noted in HPI  and remainder of comprehensive ROS otherwise negative.  HOME MEDS: Current Outpatient Medications on File Prior to Visit  Medication Sig Dispense Refill   Dulaglutide (TRULICITY [place]) Trulicity     JARDIANCE 25 MG TABS tablet Take 25 mg by mouth daily.     lisinopril (PRINIVIL,ZESTRIL) 5 MG tablet Take 1 tablet (5 mg total) by mouth daily. 30 tablet 5   ondansetron (ZOFRAN) 4 MG tablet Take 1 tablet (4 mg total) by mouth every 8 (eight) hours as needed for nausea or vomiting. 20 tablet 0   Probiotic Product (RESTORA PO) Take by mouth daily.     benzonatate (TESSALON) 100 MG capsule Take 1 capsule (100 mg total) by mouth every 8 (eight) hours. (Patient not taking: Reported on 09/24/2022) 21 capsule 0   chlorpheniramine-HYDROcodone (TUSSIONEX PENNKINETIC ER) 10-8 MG/5ML SUER Take 5 mLs by mouth at bedtime as needed for cough. (Patient not taking: Reported on 09/24/2022) 60 mL 0   fluticasone (FLONASE) 50 MCG/ACT nasal spray Place 2 sprays into both nostrils daily. (Patient not taking: Reported on 09/24/2022) 1 g 0   ipratropium (ATROVENT) 0.06 % nasal spray Place 2 sprays into both nostrils 4 (four) times daily. (Patient not taking: Reported on 09/24/2022) 15 mL 0   meloxicam (MOBIC) 7.5 MG tablet Take 1 tablet (7.5 mg total) by mouth daily. (Patient not taking: Reported on 09/24/2022) 15 tablet 0   metFORMIN (GLUCOPHAGE) 1000 MG tablet TAKE 1 TABLET BY MOUTH 2 TIMES DAILY WITH A  MEAL. (Patient not taking: Reported on 09/24/2022) 60 tablet 5   methocarbamol (ROBAXIN) 500 MG tablet Take 1 tablet (500 mg total) by mouth 2 (two) times daily. (Patient not taking: Reported on 09/24/2022) 10 tablet 0   oxyCODONE 10 MG TABS Take 1 tablet (10 mg total) by mouth every 4 (four) hours as needed for moderate pain. (Patient not taking: Reported on 09/24/2022) 20 tablet 0   No current facility-administered medications on file prior to visit.    LABS/IMAGING: No results found for this or any previous visit (from the  past 48 hour(s)). No results found.  LIPID PANEL:    Component Value Date/Time   CHOL 198 07/11/2015 0840   TRIG 95 07/11/2015 0840   HDL 52 07/11/2015 0840   CHOLHDL 3.8 07/11/2015 0840   VLDL 19 07/11/2015 0840   LDLCALC 127 07/11/2015 0840    WEIGHTS: Wt Readings from Last 3 Encounters:  09/24/22 273 lb 3.2 oz (123.9 kg)  12/20/17 296 lb (134.3 kg)  02/01/17 (!) 318 lb (144.2 kg)    VITALS: BP 126/66 (BP Location: Left Arm, Patient Position: Sitting, Cuff Size: Large)   Pulse 74   Ht 5\' 6"  (1.676 m)   Wt 273 lb 3.2 oz (123.9 kg)   SpO2 97%   BMI 44.10 kg/m   EXAM: Deferred  EKG: Deferred  ASSESSMENT: Mixed dyslipidemia, goal LDL less than 70 Type 2 diabetes Morbid obesity Hypertension statin intolerance Statin intolerance-Myalgias  PLAN: 1.   Olivia Middleton has a mixed dyslipidemia and is well above target LDL less than 70.  She has type 2 diabetes and hypertension and cannot tolerate numerous statins that she is tried in the past.  Although she has had some significant weight loss over the past several years, her cholesterol remains elevated.  She is a good candidate for PCSK9 inhibitor therapy.  Her insurance might be more amenable to cover Leqvio which would be preferable to her due to the decreased frequency of injections and the fact that she does not personally want to give herself injections.  Will reach out for prior authorization for this.  If approved plan lipid testing including NMR and LP(a) and follow-up with me likely in 6 months or sooner as necessary.  Thanks again for the kind referral.  Chrystie Nose, MD, [facility]  [place]  [facility]  Medical Director of the Advanced Lipid Disorders &  Cardiovascular Risk Reduction Clinic Diplomate of the American Board of Clinical Lipidology Attending Cardiologist  Direct Dial: [phone]  Fax: [phone]  Website:  [email or web address] 09/24/2022, 4:48 PM

## 2022-09-24 NOTE — Patient Instructions (Addendum)
Medication Instructions:  Start Leqvio *If you need a refill on your cardiac medications before your next appointment, please call your pharmacy*   Lab Work: MNR, Lipoprotein A : 1 week If you have labs (blood work) drawn today and your tests are completely normal, you will receive your results only by: MyChart Message (if you have MyChart) OR A paper copy in the mail If you have any lab test that is abnormal or we need to change your treatment, we will call you to review the results.   Testing/Procedures: No Testing   Follow-Up: At [facility], you and your health needs are our priority.  As part of our continuing mission to provide you with exceptional heart care, we have created designated Provider Care Teams.  These Care Teams include your primary Cardiologist (physician) and Advanced Practice Providers (APPs -  Physician Assistants and Nurse Practitioners) who all work together to provide you with the care you need, when you need it.  We recommend signing up for the patient portal called "MyChart".  Sign up information is provided on this After Visit Summary.  MyChart is used to connect with patients for Virtual Visits (Telemedicine).  Patients are able to view lab/test results, encounter notes, upcoming appointments, etc.  Non-urgent messages can be sent to your provider as well.   To learn more about what you can do with MyChart, go to [email or web address].    Your next appointment:   6 month(s)  Provider:   Chrystie Nose, MD

## 2022-09-25 ENCOUNTER — Other Ambulatory Visit: Payer: Self-pay | Admitting: Internal Medicine

## 2022-09-25 DIAGNOSIS — Z1231 Encounter for screening mammogram for malignant neoplasm of breast: Secondary | ICD-10-CM

## 2022-09-28 ENCOUNTER — Other Ambulatory Visit: Payer: Self-pay | Admitting: Internal Medicine

## 2022-09-28 DIAGNOSIS — Z13820 Encounter for screening for osteoporosis: Secondary | ICD-10-CM

## 2022-10-02 ENCOUNTER — Ambulatory Visit
Admission: RE | Admit: 2022-10-02 | Discharge: 2022-10-02 | Disposition: A | Discharge status: Home / Self Care | Payer: BC Managed Care – PPO | Source: Ambulatory Visit | Attending: Internal Medicine | Admitting: Internal Medicine

## 2022-10-02 DIAGNOSIS — Z1231 Encounter for screening mammogram for malignant neoplasm of breast: Secondary | ICD-10-CM

## 2022-10-07 ENCOUNTER — Other Ambulatory Visit: Payer: Self-pay | Admitting: Pharmacist Clinician (PhC)/ Clinical Pharmacy Specialist

## 2022-10-07 DIAGNOSIS — E785 Hyperlipidemia, unspecified: Secondary | ICD-10-CM | POA: Insufficient documentation

## 2022-10-08 ENCOUNTER — Telehealth: Payer: Self-pay | Admitting: Internal Medicine

## 2022-10-08 NOTE — Telephone Encounter (Signed)
Patient has been identified as candidate for Leqvio  Benefits investigation enrollment completed on 10/01/2022  Benefits investigation report notes the following:  Type of insurance: [employer] of [employer] comm[employer] - PPO SmartChoice Basic Plus  OOP Max: [phone] / Met: $670.43  Deductible: none  Co-insurance: none  PA required: YES  PA phone number: [phone] OR fax PA form/letter of medical necessity to [phone]  Benefits Summary Details:  $80 copay Once OOP max is met, Leqvio is covered at 100%  Per chart notes, co-pay card enrollment has already occurred.

## 2022-10-26 ENCOUNTER — Telehealth: Payer: Self-pay | Admitting: Pharmacy Technician

## 2022-10-26 ENCOUNTER — Telehealth: Payer: Self-pay

## 2022-10-26 NOTE — Telephone Encounter (Addendum)
Olivia Middleton has been denied due to patient has not tried or failed preferred medications. Praluent Repatha.  If you would like to appeal please call [phone] Ref: [id number]  Denial letter has been scanned to media tab for your review.  Thanks Kim.  Auth Submission: DENIED Site of care: Site of care: [facility] Payer:  Medication & CPT/J Code(s) submitted: Leqvio (Inclisiran) [phone] Route of submission (phone, fax, portal): portal Phone # Fax # Auth type: Buy/Bill PB Units/visits requested: 284mg  q3 months Reference number: [id number] Approval from:  to     Authorization has been DENIED because patient has not tried or failed preferred med.

## 2022-10-26 NOTE — Telephone Encounter (Signed)
Auth Submission: DENIED Site of care: Site of care: [facility] Payer: BCBS Medication & CPT/J Code(s) submitted: Leqvio (Inclisiran) [phone] Route of submission (phone, fax, portal): Fax Phone # Fax # Auth type: Buy/Bill PB  Reference number: [id number]   Authorization has been DENIED because "does not meet the definition of Medical Necessity found in the member's benefit booklet."

## 2022-10-29 ENCOUNTER — Encounter: Payer: Self-pay | Admitting: Internal Medicine

## 2022-10-29 ENCOUNTER — Other Ambulatory Visit (HOSPITAL_COMMUNITY): Payer: Self-pay

## 2022-10-29 ENCOUNTER — Telehealth: Payer: Self-pay

## 2022-10-29 NOTE — Telephone Encounter (Signed)
Pharmacy Patient Advocate Encounter   Received notification from Physician's Office/RN JENNA E that prior authorization for REPATHA is required/requested.   Insurance verification completed.   The patient is insured through [facility] .   Per test claim: PA required; PA submitted to [facility] via CoverMyMeds Key/confirmation #/EOC Key: [id number]      Status is pending

## 2022-10-29 NOTE — Telephone Encounter (Signed)
Olivia Nose, MD  You19 minutes ago (8:10 AM)    Would pursue antibody PCSK9i since Leqvio was denied- cannot take statins.  Dr. Rexene Edison

## 2022-10-29 NOTE — Telephone Encounter (Signed)
LM for patient to discuss Leqvio denial, now plan for PCSK9. MyChart message also sent

## 2022-10-30 ENCOUNTER — Other Ambulatory Visit (HOSPITAL_COMMUNITY): Payer: Self-pay

## 2022-10-30 NOTE — Telephone Encounter (Signed)
Update sent to patient in MyChart 

## 2022-10-30 NOTE — Telephone Encounter (Signed)
Pharmacy Patient Advocate Encounter  Received notification from [facility] that Prior Authorization for repatha has been APPROVED from 10/29/22 to 10/28/23. Ran test claim, Copay is $30.00. This test claim was processed through [facility]- copay amounts may vary at other pharmacies due to pharmacy/plan contracts, or as the patient moves through the different stages of their insurance plan.   PA #/Case ID/Reference #: [id number]

## 2022-11-04 ENCOUNTER — Telehealth: Payer: Self-pay | Admitting: Internal Medicine

## 2022-11-04 DIAGNOSIS — E785 Hyperlipidemia, unspecified: Secondary | ICD-10-CM

## 2022-11-04 MED ORDER — REPATHA SURECLICK 140 MG/ML ~~LOC~~ SOAJ: 140.0000 mg | SUBCUTANEOUS | 3 refills | Status: DC

## 2022-11-04 NOTE — Telephone Encounter (Signed)
Spoke with patient about Repatha. Reviewed with her administration technique (she already takes trulicity), frequency, storage, SE. She verbalized understanding. Scheduled her 3-4 month f/u for 12/19 -- was tentative for Jan 2025 (if on leqvio, but this was denied).   Rx(s) sent to pharmacy electronically -- [facility]: Advised her on how to obtain a copay card online.  Routed to MD to advise on lab orders -- NMR, LPa were last checked. LPa slightly elevated.       Total time: 15 minutes

## 2022-11-04 NOTE — Telephone Encounter (Signed)
Patient is requesting to speak with RN Eileen Stanford. Please advise.

## 2022-11-05 NOTE — Telephone Encounter (Signed)
NMR lipoprofile and LPa ordered -- lab slips mailed

## 2022-11-06 NOTE — Telephone Encounter (Signed)
Rx(s) sent to pharmacy electronically. Lab ordered

## 2023-02-12 LAB — NMR, LIPOPROFILE
Cholesterol, Total: 131 mg/dL (ref 100–199)
HDL Particle Number: 32.4 umol/L (ref >=30.5)
HDL-C: 55 mg/dL (ref >39)
LDL Particle Number: 677 nmol/L (ref <1000)
LDL Size: 21.6 nmol (ref >20.5)
LDL-C (NIH Calc): 59 mg/dL (ref 0–99)
LP-IR Score: 45 (ref <=45)
Small LDL Particle Number: 218 nmol/L (ref <=527)
Triglycerides: 88 mg/dL (ref 0–149)

## 2023-02-12 LAB — LIPOPROTEIN A (LPA): Lipoprotein (a): 30.6 nmol/L (ref <75.0)

## 2023-02-18 ENCOUNTER — Ambulatory Visit: Payer: BC Managed Care – PPO | Attending: Internal Medicine | Admitting: Internal Medicine

## 2023-02-18 VITALS — BP 118/72 | HR 68 | Ht 66.0 in | Wt 278.0 lb

## 2023-02-18 DIAGNOSIS — E785 Hyperlipidemia, unspecified: Secondary | ICD-10-CM

## 2023-02-18 DIAGNOSIS — E7841 Elevated Lipoprotein(a): Secondary | ICD-10-CM

## 2023-02-18 DIAGNOSIS — I10 Essential (primary) hypertension: Secondary | ICD-10-CM

## 2023-02-18 NOTE — Patient Instructions (Signed)
Medication Instructions:  No changes  *If you need a refill on your cardiac medications before your next appointment, please call your pharmacy*   Lab Work: NMR in 1 year  If you have labs (blood work) drawn today and your tests are completely normal, you will receive your results only by: MyChart Message (if you have MyChart) OR A paper copy in the mail If you have any lab test that is abnormal or we need to change your treatment, we will call you to review the results.   Testing/Procedures: None needed    Follow-Up: At [facility], you and your health needs are our priority.  As part of our continuing mission to provide you with exceptional heart care, we have created designated Provider Care Teams.  These Care Teams include your primary Cardiologist (physician) and Advanced Practice Providers (APPs -  Physician Assistants and Nurse Practitioners) who all work together to provide you with the care you need, when you need it.   Your next appointment:   1 year(s)  Provider:   Rennis Golden

## 2023-02-18 NOTE — Progress Notes (Signed)
LIPID CLINIC CONSULT NOTE  Chief Complaint:  Manage dyslipidemia  Primary Care Physician: Maurice Small, MD (Inactive)  Primary Cardiologist:  None  HPI:  Olivia Middleton is a 67 y.o. female who is being seen today for the evaluation of dyslipidemia at the request of No ref. provider found. this is a pleasant 67 year old female kindly referred for evaluation management of dyslipidemia.  She reports longstanding history of high cholesterol and cardiovascular risk factors including type 2 diabetes and hypertension.  Unfortunately she has a history of statin intolerance.  She reports having tried numerous statins in the past all of which caused significant myalgias.  But her most recent lipid profile showed total cholesterol 232, triglycerides 125, HDL 47 and LDL 163.  We discussed possible treatment options with her today.  Given her diabetes her target LDL is less than 70 and she will need more than 50% reduction in LDL to reach that target.  02/18/2023  Olivia Middleton is seen today in follow-up.  She has had an excellent response to Repatha.  She is tolerating very well.  Her particle numbers come down from 1597 down to 677, LDL is now 59 (down from 136), total cholesterol is 131 down from 202.  Small LDL particle is low.  Moreover her LP(a) is come down as well from 80.9 to 30.6 nmol/L.  PMHx:  Past Medical History:  Diagnosis Date   Diabetes mellitus    Hypertension    Seasonal allergies     Past Surgical History:  Procedure Laterality Date   ABDOMINAL HYSTERECTOMY     CHOLECYSTECTOMY N/A 01/31/2017   Procedure: LAPAROSCOPIC CHOLECYSTECTOMY;  Surgeon: Emelia Loron, MD;  Location: [facility] OR;  Service: General;  Laterality: N/A;   ERCP N/A 01/30/2017   Procedure: ENDOSCOPIC RETROGRADE CHOLANGIOPANCREATOGRAPHY (ERCP);  Surgeon: Vida Rigger, MD;  Location: [facility] ENDOSCOPY;  Service: Endoscopy;  Laterality: N/A;   KNEE ARTHROSCOPY     NOSE SURGERY     TUBAL LIGATION      FAMHx:   Family History  Problem Relation Age of Onset   Hypertension Father    Breast cancer Neg Hx     SOCHx:   reports that she has quit smoking. She has never used smokeless tobacco. She reports that she does not drink alcohol and does not use drugs.  ALLERGIES:  Allergies  Allergen Reactions   Erythromycin    Tetracyclines & Related     ROS: Pertinent items noted in HPI and remainder of comprehensive ROS otherwise negative.  HOME MEDS: Current Outpatient Medications on File Prior to Visit  Medication Sig Dispense Refill   benzonatate (TESSALON) 100 MG capsule Take 1 capsule (100 mg total) by mouth every 8 (eight) hours. 21 capsule 0   chlorpheniramine-HYDROcodone (TUSSIONEX PENNKINETIC ER) 10-8 MG/5ML SUER Take 5 mLs by mouth at bedtime as needed for cough. 60 mL 0   Dulaglutide (TRULICITY [place]) Trulicity     Evolocumab (REPATHA SURECL[place]) 140 MG/ML SOAJ Inject 140 mg into the skin every 14 (fourteen) days. 6 mL 3   fluticasone (FLONASE) 50 MCG/ACT nasal spray Place 2 sprays into both nostrils daily. 1 g 0   ipratropium (ATROVENT) 0.06 % nasal spray Place 2 sprays into both nostrils 4 (four) times daily. 15 mL 0   JARDIANCE 25 MG TABS tablet Take 25 mg by mouth daily.     lisinopril (PRINIVIL,ZESTRIL) 5 MG tablet Take 1 tablet (5 mg total) by mouth daily. 30 tablet 5   meloxicam (MOBIC) 7.5 MG tablet Take  1 tablet (7.5 mg total) by mouth daily. 15 tablet 0   metFORMIN (GLUCOPHAGE) 1000 MG tablet TAKE 1 TABLET BY MOUTH 2 TIMES DAILY WITH A MEAL. 60 tablet 5   methocarbamol (ROBAXIN) 500 MG tablet Take 1 tablet (500 mg total) by mouth 2 (two) times daily. 10 tablet 0   ondansetron (ZOFRAN) 4 MG tablet Take 1 tablet (4 mg total) by mouth every 8 (eight) hours as needed for nausea or vomiting. 20 tablet 0   oxyCODONE 10 MG TABS Take 1 tablet (10 mg total) by mouth every 4 (four) hours as needed for moderate pain. 20 tablet 0   Probiotic Product (RESTORA PO) Take by mouth daily.     No  current facility-administered medications on file prior to visit.    LABS/IMAGING: No results found for this or any previous visit (from the past 48 hours). No results found.  LIPID PANEL:    Component Value Date/Time   CHOL 198 07/11/2015 0840   TRIG 95 07/11/2015 0840   HDL 52 07/11/2015 0840   CHOLHDL 3.8 07/11/2015 0840   VLDL 19 07/11/2015 0840   LDLCALC 127 07/11/2015 0840    WEIGHTS: Wt Readings from Last 3 Encounters:  02/18/23 278 lb (126.1 kg)  09/24/22 273 lb 3.2 oz (123.9 kg)  12/20/17 296 lb (134.3 kg)    VITALS: BP 118/72 (BP Location: Left Arm, Patient Position: Sitting, Cuff Size: Large)   Pulse 68   Ht 5\' 6"  (1.676 m)   Wt 278 lb (126.1 kg)   SpO2 94%   BMI 44.87 kg/m   EXAM: Deferred  EKG: Deferred  ASSESSMENT: Mixed dyslipidemia, goal LDL less than 70 Elevated LP(a) at 80.9 nmol/L-improved to 30.6 nmol/L on Repatha Type 2 diabetes Morbid obesity Hypertension statin intolerance Statin intolerance-Myalgias  PLAN: 1.   Olivia Middleton has had an excellent response to Repatha.  She is tolerating well.  Her LDL is dramatically reduced and she has had a significant improvement in LP(a).  Will continue current therapy as is well-tolerated.  Plan repeat lipid NMR and follow-up in 1 year.  Chrystie Nose, MD, [facility], FACP  [place]  [facility] HeartCare  Medical Director of the Advanced Lipid Disorders &  Cardiovascular Risk Reduction Clinic Diplomate of the American Board of Clinical Lipidology Attending Cardiologist  Direct Dial: [phone]  Fax: [phone]  Website:  [email or web address] Nicely Chole Driver 02/18/2023, 9:51 AM

## 2023-02-19 NOTE — Addendum Note (Signed)
Addended by: Lindell Spar on: 02/19/2023 08:20 AM   Modules accepted: Orders

## 2023-03-01 ENCOUNTER — Encounter: Payer: Self-pay | Admitting: Internal Medicine

## 2023-04-15 ENCOUNTER — Ambulatory Visit
Admission: RE | Admit: 2023-04-15 | Discharge: 2023-04-15 | Disposition: A | Discharge status: Home / Self Care | Payer: 59 | Source: Ambulatory Visit | Attending: Internal Medicine | Admitting: Internal Medicine

## 2023-04-15 DIAGNOSIS — Z13820 Encounter for screening for osteoporosis: Secondary | ICD-10-CM

## 2023-09-02 ENCOUNTER — Other Ambulatory Visit (HOSPITAL_COMMUNITY): Payer: Self-pay | Admitting: Internal Medicine

## 2023-09-02 DIAGNOSIS — Z8349 Family history of other endocrine, nutritional and metabolic diseases: Secondary | ICD-10-CM

## 2023-09-02 DIAGNOSIS — E0590 Thyrotoxicosis, unspecified without thyrotoxic crisis or storm: Secondary | ICD-10-CM

## 2023-09-02 DIAGNOSIS — R946 Abnormal results of thyroid function studies: Secondary | ICD-10-CM

## 2023-09-13 ENCOUNTER — Ambulatory Visit (HOSPITAL_COMMUNITY): Admission: RE | Admit: 2023-09-13 | Source: Ambulatory Visit

## 2023-09-13 ENCOUNTER — Ambulatory Visit (HOSPITAL_COMMUNITY)

## 2023-09-14 ENCOUNTER — Ambulatory Visit (HOSPITAL_COMMUNITY)

## 2023-09-19 ENCOUNTER — Other Ambulatory Visit: Payer: Self-pay | Admitting: Internal Medicine

## 2023-09-21 ENCOUNTER — Ambulatory Visit (HOSPITAL_COMMUNITY)
Admission: RE | Admit: 2023-09-21 | Discharge: 2023-09-21 | Disposition: A | Discharge status: Home / Self Care | Source: Ambulatory Visit | Attending: Internal Medicine | Admitting: Internal Medicine

## 2023-09-21 DIAGNOSIS — Z8349 Family history of other endocrine, nutritional and metabolic diseases: Secondary | ICD-10-CM | POA: Insufficient documentation

## 2023-09-21 DIAGNOSIS — E0590 Thyrotoxicosis, unspecified without thyrotoxic crisis or storm: Secondary | ICD-10-CM | POA: Diagnosis present

## 2023-09-21 DIAGNOSIS — R946 Abnormal results of thyroid function studies: Secondary | ICD-10-CM | POA: Diagnosis present

## 2023-09-21 MED ORDER — SODIUM IODIDE I-123 7.4 MBQ CAPS
436.0000 | ORAL_CAPSULE | Freq: Once | ORAL | Status: AC
  Administered 2023-09-21: 436 via ORAL

## 2023-09-22 ENCOUNTER — Ambulatory Visit (HOSPITAL_COMMUNITY)
Admission: RE | Admit: 2023-09-22 | Discharge: 2023-09-22 | Disposition: A | Discharge status: Home / Self Care | Source: Ambulatory Visit | Attending: Internal Medicine | Admitting: Internal Medicine

## 2023-09-29 ENCOUNTER — Telehealth: Payer: Self-pay | Admitting: Pharmacy Technician

## 2023-11-03 ENCOUNTER — Other Ambulatory Visit: Payer: Self-pay | Admitting: Internal Medicine

## 2023-11-03 DIAGNOSIS — Z1231 Encounter for screening mammogram for malignant neoplasm of breast: Secondary | ICD-10-CM

## 2023-11-10 ENCOUNTER — Ambulatory Visit
Admission: RE | Admit: 2023-11-10 | Discharge: 2023-11-10 | Disposition: A | Discharge status: Home / Self Care | Source: Ambulatory Visit | Attending: Internal Medicine | Admitting: Internal Medicine

## 2023-11-10 DIAGNOSIS — Z1231 Encounter for screening mammogram for malignant neoplasm of breast: Secondary | ICD-10-CM

## 2023-11-30 ENCOUNTER — Other Ambulatory Visit: Payer: Self-pay

## 2023-11-30 ENCOUNTER — Emergency Department
Admission: EM | Admit: 2023-11-30 | Discharge: 2023-11-30 | Disposition: A | Discharge status: Home / Self Care | Attending: Emergency Medicine | Admitting: Emergency Medicine

## 2023-11-30 ENCOUNTER — Ambulatory Visit: Admission: EM | Admit: 2023-11-30 | Discharge: 2023-11-30 | Disposition: A | Discharge status: Home / Self Care

## 2023-11-30 DIAGNOSIS — T148XXA Other injury of unspecified body region, initial encounter: Secondary | ICD-10-CM | POA: Diagnosis not present

## 2023-11-30 DIAGNOSIS — I10 Essential (primary) hypertension: Secondary | ICD-10-CM | POA: Diagnosis not present

## 2023-11-30 DIAGNOSIS — E119 Type 2 diabetes mellitus without complications: Secondary | ICD-10-CM | POA: Insufficient documentation

## 2023-11-30 DIAGNOSIS — M79641 Pain in right hand: Secondary | ICD-10-CM | POA: Diagnosis present

## 2023-11-30 DIAGNOSIS — L03113 Cellulitis of right upper limb: Secondary | ICD-10-CM | POA: Diagnosis not present

## 2023-11-30 DIAGNOSIS — L089 Local infection of the skin and subcutaneous tissue, unspecified: Secondary | ICD-10-CM | POA: Diagnosis not present

## 2023-11-30 LAB — COMPREHENSIVE METABOLIC PANEL WITH GFR
ALT: 14 U/L (ref 0–44)
AST: 20 U/L (ref 15–41)
Albumin: 3.9 g/dL (ref 3.5–5.0)
Alkaline Phosphatase: 55 U/L (ref 38–126)
Anion gap: 12 (ref 5–15)
BUN: 27 mg/dL — ABNORMAL HIGH (ref 8–23)
CO2: 23 mmol/L (ref 22–32)
Calcium: 9.5 mg/dL (ref 8.9–10.3)
Chloride: 104 mmol/L (ref 98–111)
Creatinine, Ser: 1.2 mg/dL — ABNORMAL HIGH (ref 0.44–1.00)
GFR, Estimated: 50 mL/min — ABNORMAL LOW (ref >60)
Glucose, Bld: 105 mg/dL — ABNORMAL HIGH (ref 70–99)
Potassium: 3.9 mmol/L (ref 3.5–5.1)
Sodium: 139 mmol/L (ref 135–145)
Total Bilirubin: 0.6 mg/dL (ref 0.0–1.2)
Total Protein: 7.5 g/dL (ref 6.5–8.1)

## 2023-11-30 LAB — CBC WITH DIFFERENTIAL/PLATELET
Abs Immature Granulocytes: 0.03 K/uL (ref 0.00–0.07)
Basophils Absolute: 0.2 K/uL — ABNORMAL HIGH (ref 0.0–0.1)
Basophils Relative: 2 %
Eosinophils Absolute: 0.4 K/uL (ref 0.0–0.5)
Eosinophils Relative: 4 %
HCT: 48.3 % — ABNORMAL HIGH (ref 36.0–46.0)
Hemoglobin: 16.2 g/dL — ABNORMAL HIGH (ref 12.0–15.0)
Immature Granulocytes: 0 %
Lymphocytes Relative: 33 %
Lymphs Abs: 3.4 K/uL (ref 0.7–4.0)
MCH: 30.3 pg (ref 26.0–34.0)
MCHC: 33.5 g/dL (ref 30.0–36.0)
MCV: 90.4 fL (ref 80.0–100.0)
Monocytes Absolute: 1.1 K/uL — ABNORMAL HIGH (ref 0.1–1.0)
Monocytes Relative: 10 %
Neutro Abs: 5.3 K/uL (ref 1.7–7.7)
Neutrophils Relative %: 51 %
Platelets: 245 K/uL (ref 150–400)
RBC: 5.34 MIL/uL — ABNORMAL HIGH (ref 3.87–5.11)
RDW: 14.3 % (ref 11.5–15.5)
WBC: 10.4 K/uL (ref 4.0–10.5)
nRBC: 0 % (ref 0.0–0.2)

## 2023-11-30 MED ORDER — SODIUM CHLORIDE 0.9 % IV SOLN
1.0000 g | Freq: Once | INTRAVENOUS | Status: AC
  Administered 2023-11-30: 1 g via INTRAVENOUS
  Filled 2023-11-30: qty 10

## 2023-11-30 MED ORDER — CLINDAMYCIN HCL 300 MG PO CAPS: 300.0000 mg | ORAL_CAPSULE | Freq: Three times a day (TID) | ORAL | 0 refills | Status: AC

## 2023-11-30 MED ORDER — NAPROXEN 500 MG PO TBEC: 500.0000 mg | DELAYED_RELEASE_TABLET | Freq: Two times a day (BID) | ORAL | 0 refills | Status: DC

## 2023-11-30 NOTE — Discharge Instructions (Signed)
 Go to the emergency department for evaluation of the infection in your right hand with the red streak going up your forearm.

## 2023-11-30 NOTE — ED Provider Notes (Signed)
 APP supervisory note  67 year old female presenting with redness and swelling at the location of a dog scratch that occurred 2 days ago.  No dog bite.  Patient was wearing a longsleeve shirt and towards the end today noticed that she had some red streaking up her arm.  Went to urgent care and was directed to the ER for further evaluation.  No fevers or chills.  On exam, patient has small area of erythema along the right dorsal hand in between the 1st and 2nd digit.  There is faint erythema that is tracking in a linear fashion past the wrist.  Compartments are soft.  Presentation concerning for dog scratch with resultant cellulitis, with linear streaking consideration for lymphangitic streaking.  Labs were obtained with reassuring CBC including normal white blood cell count, CMP with mild renal dysfunction.  I did consider and discussed admission with this patient given her areas of red streaking noted on exam.  Patient strongly prefers discharge home.  She reports she has reliable transportation back to the emergency room if she were to develop worsening symptoms.  Given her overall clinical well appearance, do think giving her dose of IV antibiotics here and trialing her on outpatient antibiotics is reasonable.  Very strict return precautions were provided for development of fevers, worsening redness or swelling, progressive tracking of redness up her arm, or any other new or concerning symptoms.  Discharge after dose of IV Rocephin here.   Levander Slate, MD 11/30/23 [phone]

## 2023-11-30 NOTE — ED Triage Notes (Signed)
 Patient to Urgent Care with complaints of a dog scratch to her right hand. Now has a red line/ warmth/ swelling extending up wrist. No drainage.   Symptoms started Sunday. Believes the red line started today.

## 2023-11-30 NOTE — ED Notes (Signed)
 Patient is being discharged from the Urgent Care and sent to the Emergency Department via POV . Per Burnard Cork NP, patient is in need of higher level of care due to cellulitis/ infected wound. Patient is aware and verbalizes understanding of plan of care.  Vitals:   11/30/23 1724  BP: 122/78  Pulse: 69  Resp: 18  Temp: 98.2 F (36.8 C)  SpO2: 96%

## 2023-11-30 NOTE — ED Notes (Signed)
 Pt given DC instructions. Pt verbalized understanding of medication and follow up care. Pt ambulatory from ED without difficulty.

## 2023-11-30 NOTE — Discharge Instructions (Signed)
 You have been diagnosed with right hand cellulitis.  Please take clindamycin 1 capsule by mouth every 8 hours after main meals for 10 days.  Please come back to ED or go to your PCP if you notice extension of the redness in your right arm.  Please come back to ED or go to your PCP if you have new symptoms.

## 2023-11-30 NOTE — ED Triage Notes (Signed)
 Pt reports she was scratched by her dog this past Sunday, pt repots today she noticed it has been red and has some discomfort.

## 2023-11-30 NOTE — ED Provider Notes (Signed)
 CAY RALPH PELT    CSN: [id number] Arrival date & time: 11/30/23  1700      History   Chief Complaint Chief Complaint  Patient presents with   Wound Infection    HPI Olivia Middleton is a 68 y.o. female.  Patient presents with swelling and redness of her right hand at the site of a dog scratch that occurred on 11/28/2023.  She noticed a red streak going up her forearm today.  The dog scratch has white pus-like scabs.  Patient denies fever or chills.  Last tetanus 2016.  Her medical history includes diabetes.  The history is provided by the patient and medical records.    Past Medical History:  Diagnosis Date   Diabetes mellitus    Hypertension    Seasonal allergies     Patient Active Problem List   Diagnosis Date Noted   Hyperlipidemia LDL goal <70 10/07/2022   RUQ abdominal pain 01/28/2017   Choledocholithiasis 01/28/2017   Diabetes mellitus 01/28/2017   Hypertension 01/28/2017   SIRS (systemic inflammatory response syndrome) (HCC) 01/28/2017   Abnormal urinalysis 01/28/2017   Hepatic steatosis 01/28/2017   Diabetes mellitus, type 2 (HCC) 05/06/2011    Past Surgical History:  Procedure Laterality Date   ABDOMINAL HYSTERECTOMY     CHOLECYSTECTOMY N/A 01/31/2017   Procedure: LAPAROSCOPIC CHOLECYSTECTOMY;  Surgeon: Ebbie Cough, MD;  Location[facility] OR;  Service: General;  Laterality: N/A;   ERCP N/A 01/30/2017   Procedure: ENDOSCOPIC RETROGRADE CHOLANGIOPANCREATOGRAPHY (ERCP);  Surgeon: Rosalie Kitchens, MD;  Location: [facility] ENDOSCOPY;  Service: Endoscopy;  Laterality: N/A;   KNEE ARTHROSCOPY     NOSE SURGERY     TUBAL LIGATION      OB History   No obstetric history on file.      Home Medications    Prior to Admission medications   Medication Sig Start Date End Date Taking? Authorizing Provider  MOUNJARO 2.5 MG/0.5ML Pen SMARTSIG:2.5 Milligram(s) SUB-Q Once a Week 11/15/23  Yes [provider]  benzonatate  (TESSALON ) 100 MG capsule Take 1 capsule (100  mg total) by mouth every 8 (eight) hours. Patient not taking: Reported on 11/30/2023 03/30/17   Babara Greig GAILS, PA-C  chlorpheniramine-HYDROcodone  [facility] PENNKINETIC ER) 10-8 MG/5ML SUER Take 5 mLs by mouth at bedtime as needed for cough. Patient not taking: Reported on 11/30/2023 04/13/17   Babara Greig GAILS, PA-C  Dulaglutide (TRULICITY [place]) Trulicity Patient not taking: Reported on 11/30/2023    [provider]  Evolocumab  (REPATHA  SURECLICK) 140 MG/ML SOAJ INJECT 140 MG INTO THE SKIN EVERY 14 (FOURTEEN) DAYS. 09/20/23   Mona Vinie BROCKS, MD  fluticasone  (FLONASE ) 50 MCG/ACT nasal spray Place 2 sprays into both nostrils daily. 03/30/17   Babara, Amy V, PA-C  ipratropium (ATROVENT ) 0.06 % nasal spray Place 2 sprays into both nostrils 4 (four) times daily. 03/30/17   Babara, Amy V, PA-C  JARDIANCE 25 MG TABS tablet Take 25 mg by mouth daily. 04/16/22   [provider]  lisinopril  (PRINIVIL ,ZESTRIL ) 5 MG tablet Take 1 tablet (5 mg total) by mouth daily. 01/07/16   Copland, Harlene BROCKS, MD  meloxicam  (MOBIC ) 7.5 MG tablet Take 1 tablet (7.5 mg total) by mouth daily. 03/30/17   Yu, Amy V, PA-C  metFORMIN  (GLUCOPHAGE ) 1000 MG tablet TAKE 1 TABLET BY MOUTH 2 TIMES DAILY WITH A MEAL. 03/30/15   Avram Barnie NOVAK, FNP  methocarbamol  (ROBAXIN ) 500 MG tablet Take 1 tablet (500 mg total) by mouth 2 (two) times daily. 12/20/17   Jason,  Alyssa B, PA-C  ondansetron  (ZOFRAN ) 4 MG tablet Take 1 tablet (4 mg total) by mouth every 8 (eight) hours as needed for nausea or vomiting. 02/01/17   Jadine Toribio SQUIBB, MD  oxyCODONE  10 MG TABS Take 1 tablet (10 mg total) by mouth every 4 (four) hours as needed for moderate pain. 02/01/17   Focht, Harlene CROME, PA  Probiotic Product (RESTORA PO) Take by mouth daily.    [provider]    Family History Family History  Problem Relation Age of Onset   Hypertension Father    Breast cancer Neg Hx     Social History Social History   Tobacco Use   Smoking status: Former    Smokeless tobacco: Never  Substance Use Topics   Alcohol use: No    Alcohol/week: 0.0 standard drinks of alcohol   Drug use: No     Allergies   Erythromycin and Tetracyclines & related   Review of Systems Review of Systems  Constitutional:  Negative for chills and fever.  Musculoskeletal:  Positive for arthralgias and joint swelling.  Skin:  Positive for color change and wound.  Neurological:  Negative for weakness and numbness.     Physical Exam Triage Vital Signs ED Triage Vitals  Encounter Vitals Group     BP 11/30/23 1724 122/78     Girls Systolic BP Percentile --      Girls Diastolic BP Percentile --      Boys Systolic BP Percentile --      Boys Diastolic BP Percentile --      Pulse Rate 11/30/23 1724 69     Resp 11/30/23 1724 18     Temp 11/30/23 1724 98.2 F (36.8 C)     Temp src --      SpO2 11/30/23 1724 96 %     Weight --      Height --      Head Circumference --      Peak Flow --      Pain Score 11/30/23 1725 2     Pain Loc --      Pain Education --      Exclude from Growth Chart --    No data found.  Updated Vital Signs BP 122/78   Pulse 69   Temp 98.2 F (36.8 C)   Resp 18   SpO2 96%   Visual Acuity Right Eye Distance:   Left Eye Distance:   Bilateral Distance:    Right Eye Near:   Left Eye Near:    Bilateral Near:     Physical Exam Constitutional:      General: She is not in acute distress. HENT:     Mouth/Throat:     Mouth: Mucous membranes are moist.  Cardiovascular:     Rate and Rhythm: Normal rate and regular rhythm.  Pulmonary:     Effort: Pulmonary effort is normal. No respiratory distress.  Musculoskeletal:        General: Swelling present. No deformity. Normal range of motion.  Skin:    General: Skin is warm and dry.     Capillary Refill: Capillary refill takes less than 2 seconds.     Findings: Erythema and lesion present.     Comments: Right hand: Superficial scratch with small amount of purulent drainage; right  hand edematous with erythema; red streak extending to mid forearm.  Neurological:     General: No focal deficit present.     Mental Status: She is alert.  Sensory: No sensory deficit.     Motor: No weakness.      UC Treatments / Results  Labs (all labs ordered are listed, but only abnormal results are displayed) Labs Reviewed - No data to display  EKG   Radiology No results found.  Procedures Procedures (including critical care time)  Medications Ordered in UC Medications - No data to display  Initial Impression / Assessment and Plan / UC Course  I have reviewed the triage vital signs and the nursing notes.  Pertinent labs & imaging results that were available during my care of the patient were reviewed by me and considered in my medical decision making (see chart for details).    Infected wound and cellulitis of right hand with red streak extending up to forearm.  Patient is diabetic.  Afebrile and vital signs are stable.  Discussed with patient the limitations of evaluation and treatment of her symptoms in an urgent care setting.  Discussed that she may need blood work and IV antibiotics.  Sending her to the ED for evaluation.  She is agreeable to this and will have her husband drive her to [facility] ED in [place].  Final Clinical Impressions(s) / UC Diagnoses   Final diagnoses:  Cellulitis of right hand  Infected wound     Discharge Instructions      Go to the emergency department for evaluation of the infection in your right hand with the red streak going up your forearm.     ED Prescriptions   None    PDMP not reviewed this encounter.   Corlis Burnard DEL, NP 11/30/23 [phone]

## 2023-11-30 NOTE — ED Provider Notes (Signed)
 [facility] Provider Note    Event Date/Time   First MD Initiated Contact with Patient 11/30/23 1955     (approximate)   History   Hand Pain    HPI  Olivia Middleton is a 68 y.o. female    with a past medical history of cellulitis of right hand, hyperlipidemia, lymphedema, right wrist pain, bronchitis, laparoscopic cholecystectomy, who presents to the ED complaining of hand pain. According to the patient, her dog scratched her right hand 4 days ago.  Patient denies fever, pain. She was seen today in urgent care and was referred to ED for further studies.  But independent chart review patient was diagnosed today with cellulitis of right hand, infected wound.  She was referred here for blood work and possible IV antibiotics.  Patient is here by herself.  Her dog is 51 78 months old and he has all immunizations up-to-date.     Patient Active Problem List   Diagnosis Date Noted   Hyperlipidemia LDL goal <70 10/07/2022   RUQ abdominal pain 01/28/2017   Choledocholithiasis 01/28/2017   Diabetes mellitus 01/28/2017   Hypertension 01/28/2017   SIRS (systemic inflammatory response syndrome) (HCC) 01/28/2017   Abnormal urinalysis 01/28/2017   Hepatic steatosis 01/28/2017   Diabetes mellitus, type 2 (HCC) 05/06/2011     ROS: Patient currently denies any vision changes, tinnitus, difficulty speaking, facial droop, sore throat, chest pain, shortness of breath, abdominal pain, nausea/vomiting/diarrhea, dysuria, or weakness/numbness/paresthesias in any extremity   Physical Exam   Triage Vital Signs: ED Triage Vitals  Encounter Vitals Group     BP 11/30/23 1912 (!) 142/74     Girls Systolic BP Percentile --      Girls Diastolic BP Percentile --      Boys Systolic BP Percentile --      Boys Diastolic BP Percentile --      Pulse Rate 11/30/23 1912 63     Resp 11/30/23 1912 17     Temp 11/30/23 1912 98.6 F (37 C)     Temp src --      SpO2 11/30/23 1912 96 %      Weight 11/30/23 1911 268 lb (121.6 kg)     Height 11/30/23 1911 5' 6 (1.676 m)     Head Circumference --      Peak Flow --      Pain Score 11/30/23 1911 1     Pain Loc --      Pain Education --      Exclude from Growth Chart --     Most recent vital signs: Vitals:   11/30/23 1912  BP: (!) 142/74  Pulse: 63  Resp: 17  Temp: 98.6 F (37 C)  SpO2: 96%     Physical Exam Vitals and nursing note reviewed.  During triage patient was hypertensive  Constitutional:      General: Awake and alert. No acute distress.    Appearance: Normal appearance. The patient is normal weight.      Able to speak in complete sentences without cough or dyspnea  HENT:     Head: Normocephalic and atraumatic.     Mouth: Mucous membranes are moist.  Eyes:     General: PERRL. Normal EOMs          Conjunctiva/sclera: Conjunctivae normal.  Nose No congestion/rhinorrhea  CV:                  Good peripheral perfusion.  Regular rate and rhythm  Resp:               Normal effort.  Equal breath sounds bilaterally.  Abd:                 No distention.  Soft, nontender.  No rebound or guarding.  Musculoskeletal:        General: No swelling. Normal range of motion.  Skin:    General: Skin is warm and dry.     Capillary Refill: Capillary refill takes less than 2 seconds.     Findings: Right hand: Presence of  2  punctuated lesions in the right hand at the base of the second metacarpal in the volar area, erythema, tenderness to palpation, mild edema  Evidence of red strike extending to mid forearm.  Fingers full ROM Neurological:     Mental Status: The patient is awake and alert. MAE spontaneously. No gross focal neurologic deficits are appreciated.  Psychiatric Mood and affect are normal. Speech and behavior are normal.  ED Results / Procedures / Treatments   Labs (all labs ordered are listed, but only abnormal results are displayed) Labs Reviewed  COMPREHENSIVE METABOLIC PANEL WITH GFR - Abnormal;  Notable for the following components:      Result Value   Glucose, Bld 105 (*)    BUN 27 (*)    Creatinine, Ser 1.20 (*)    GFR, Estimated 50 (*)    All other components within normal limits  CBC WITH DIFFERENTIAL/PLATELET - Abnormal; Notable for the following components:   RBC 5.34 (*)    Hemoglobin 16.2 (*)    HCT 48.3 (*)    Monocytes Absolute 1.1 (*)    Basophils Absolute 0.2 (*)    All other components within normal limits  CULTURE, BLOOD (SINGLE)      PROCEDURES:  Critical Care performed:   Procedures   MEDICATIONS ORDERED IN ED: Medications  cefTRIAXone (ROCEPHIN) 1 g in sodium chloride  0.9 % 100 mL IVPB (1 g Intravenous New Bag/Given 11/30/23 2307)      IMPRESSION / MDM / ASSESSMENT AND PLAN / ED COURSE  I reviewed the triage vital signs and the nursing notes.  Differential diagnosis includes, but is not limited to, cellulitis, abscess, soft tissue injury  Patient's presentation is most consistent with acute complicated illness / injury requiring diagnostic workup.   Olivia Middleton is a 68 y.o., female who presents today for history of 4 days of dog scratch on right hand with subsequent tenderness.  On a physical exam there is evidence of 2 punctuated lesions in the volar area at the base of the second metacarpal.  There is erythema, mild edema, tenderness to palpation.  Evidence of red streak going into the mid forearm. Plan CBC, CMP Reassess CBC showed white blood cells within normal limits.  Consulted case with Dr. Vertell who personally evaluated the patient.  Ordered Rocephin IV, and continue clindamycin as outpatient. Patient's diagnosis is consistent with cellulitis of the right hand. I independently reviewed and interpreted imaging and agree with radiologists findings. Labs are  reassuring. I did review the patient's allergies and medications.Patient is allergic to tetracyclines erythromycin. The patient is in stable and satisfactory condition for discharge home   Patient will be discharged home with prescriptions for clindamycin. Patient is to follow up with PCP as needed or otherwise directed. Patient is given ED precautions to return to the ED for any worsening or new symptoms. Discussed plan of care with patient, answered all of patient's questions,  Patient agreeable to plan of care. Advised patient to take medications according to the instructions on the label. Discussed possible side effects of new medications. Patient verbalized understanding.  FINAL CLINICAL IMPRESSION(S) / ED DIAGNOSES   Final diagnoses:  Cellulitis of right hand     Rx / DC Orders   ED Discharge Orders          Ordered    clindamycin (CLEOCIN) 300 MG capsule  3 times daily        11/30/23 2300    naproxen (EC NAPROSYN) 500 MG EC tablet  2 times daily with meals,   Status:  Discontinued        11/30/23 2301             Note:  This document was prepared using Dragon voice recognition software and may include unintentional dictation errors.   Janit Kast, PA-C 11/30/23 7668    Levander Slate, MD 12/06/23 [phone]

## 2023-12-05 LAB — CULTURE, BLOOD (SINGLE): Culture: NO GROWTH

## 2023-12-24 ENCOUNTER — Other Ambulatory Visit: Payer: Self-pay | Admitting: *Deleted

## 2023-12-24 DIAGNOSIS — E785 Hyperlipidemia, unspecified: Secondary | ICD-10-CM

## 2023-12-24 DIAGNOSIS — E7841 Elevated Lipoprotein(a): Secondary | ICD-10-CM

## 2024-02-08 LAB — NMR, LIPOPROFILE
Cholesterol, Total: 156 mg/dL (ref 100–199)
HDL Particle Number: 36.9 umol/L (ref >=30.5)
HDL-C: 61 mg/dL (ref >39)
LDL Particle Number: 822 nmol/L (ref <1000)
LDL Size: 21 nm (ref >20.5)
LDL-C (NIH Calc): 78 mg/dL (ref 0–99)
LP-IR Score: 55 — ABNORMAL HIGH (ref <=45)
Small LDL Particle Number: 455 nmol/L (ref <=527)
Triglycerides: 95 mg/dL (ref 0–149)

## 2024-02-09 ENCOUNTER — Ambulatory Visit: Payer: Self-pay | Admitting: Internal Medicine

## 2024-02-14 ENCOUNTER — Ambulatory Visit: Admitting: Internal Medicine

## 2024-02-14 ENCOUNTER — Encounter: Payer: Self-pay | Admitting: Internal Medicine

## 2024-02-14 VITALS — BP 126/74 | HR 70 | Ht 66.0 in | Wt 276.8 lb

## 2024-02-14 DIAGNOSIS — E7841 Elevated Lipoprotein(a): Secondary | ICD-10-CM | POA: Diagnosis not present

## 2024-02-14 DIAGNOSIS — E785 Hyperlipidemia, unspecified: Secondary | ICD-10-CM

## 2024-02-14 NOTE — Patient Instructions (Signed)
 Medication Instructions:  NO CHANGES  *If you need a refill on your cardiac medications before your next appointment, please call your pharmacy*  Lab Work: FASTING lab work in 1 year   If you have labs (blood work) drawn today and your tests are completely normal, you will receive your results only by: MyChart Message (if you have MyChart) OR A paper copy in the mail If you have any lab test that is abnormal or we need to change your treatment, we will call you to review the results.  Follow-Up: At [facility], you and your health needs are our priority.  As part of our continuing mission to provide you with exceptional heart care, our providers are all part of one team.  This team includes your primary Cardiologist (physician) and Advanced Practice Providers or APPs (Physician Assistants and Nurse Practitioners) who all work together to provide you with the care you need, when you need it.  Your next appointment:    12 months with Dr. Mona or Rosaline Bane NP  We recommend signing up for the patient portal called MyChart.  Sign up information is provided on this After Visit Summary.  MyChart is used to connect with patients for Virtual Visits (Telemedicine).  Patients are able to view lab/test results, encounter notes, upcoming appointments, etc.  Non-urgent messages can be sent to your provider as well.   To learn more about what you can do with MyChart, go to [email or web address].   Other Instructions

## 2024-02-14 NOTE — Progress Notes (Signed)
 LIPID CLINIC CONSULT NOTE  Chief Complaint:  Manage dyslipidemia  Primary Care Physician: Vernon Velna SAUNDERS, MD  Primary Cardiologist:  None  HPI:  Olivia Middleton is a 68 y.o. female who is being seen today for the evaluation of dyslipidemia at the request of Pahwani, Velna SAUNDERS, MD. this is a pleasant 68 year old female kindly referred for evaluation management of dyslipidemia.  She reports longstanding history of high cholesterol and cardiovascular risk factors including type 2 diabetes and hypertension.  Unfortunately she has a history of statin intolerance.  She reports having tried numerous statins in the past all of which caused significant myalgias.  But her most recent lipid profile showed total cholesterol 232, triglycerides 125, HDL 47 and LDL 163.  We discussed possible treatment options with her today.  Given her diabetes her target LDL is less than 70 and she will need more than 50% reduction in LDL to reach that target.  02/18/2023  Ms. Bakke is seen today in follow-up.  She has had an excellent response to Repatha .  She is tolerating very well.  Her particle numbers come down from 1597 down to 677, LDL is now 59 (down from 136), total cholesterol is 131 down from 202.  Small LDL particle is low.  Moreover her LP(a) is come down as well from 80.9 to 30.6 nmol/L.  02/14/2024  Ms. Baquero is seen today in follow-up.  She had a repeat lipid profile recently which showed an LDL particle number of 822, LDL 78 (up from 59), HDL 61 and triglycerides 95.  Her small LDL particle number was 455.  Overall she continues to tolerate Repatha  without issues.  Recently she was started on Mounjaro.  She says she has had some increase in her appetite but hopes to have higher doses of the medicine to help her with weight loss.  She has also been struggling with some arthritis.  She wonders if she might have rheumatoid arthritis because of a family history of this.  She says she has never been tested for  that  PMHx:  Past Medical History:  Diagnosis Date   Diabetes mellitus    Hypertension    Seasonal allergies     Past Surgical History:  Procedure Laterality Date   ABDOMINAL HYSTERECTOMY     CHOLECYSTECTOMY N/A 01/31/2017   Procedure: LAPAROSCOPIC CHOLECYSTECTOMY;  Surgeon: Ebbie Cough, MD;  Location: [facility] OR;  Service: General;  Laterality: N/A;   ERCP N/A 01/30/2017   Procedure: ENDOSCOPIC RETROGRADE CHOLANGIOPANCREATOGRAPHY (ERCP);  Surgeon: Rosalie Kitchens, MD;  Location: [facility] ENDOSCOPY;  Service: Endoscopy;  Laterality: N/A;   KNEE ARTHROSCOPY     NOSE SURGERY     TUBAL LIGATION      FAMHx:  Family History  Problem Relation Age of Onset   Hypertension Father    Breast cancer Neg Hx     SOCHx:   reports that she has quit smoking. She has never used smokeless tobacco. She reports that she does not drink alcohol and does not use drugs.  ALLERGIES:  Allergies  Allergen Reactions   Erythromycin    Tetracyclines & Related     ROS: Pertinent items noted in HPI and remainder of comprehensive ROS otherwise negative.  HOME MEDS: Current Outpatient Medications on File Prior to Visit  Medication Sig Dispense Refill   Evolocumab  (REPATHA  SURECLICK) 140 MG/ML SOAJ INJECT 140 MG INTO THE SKIN EVERY 14 (FOURTEEN) DAYS. 6 mL 3   JARDIANCE 25 MG TABS tablet Take 25 mg by mouth daily.  lisinopril  (PRINIVIL ,ZESTRIL ) 5 MG tablet Take 1 tablet (5 mg total) by mouth daily. 30 tablet 5   MOUNJARO 2.5 MG/0.5ML Pen SMARTSIG:2.5 Milligram(s) SUB-Q Once a Week     Probiotic Product (RESTORA PO) Take by mouth daily.     benzonatate  (TESSALON ) 100 MG capsule Take 1 capsule (100 mg total) by mouth every 8 (eight) hours. (Patient not taking: Reported on 02/14/2024) 21 capsule 0   chlorpheniramine-HYDROcodone  (TUSSIONEX PENNKINETIC ER) 10-8 MG/5ML SUER Take 5 mLs by mouth at bedtime as needed for cough. (Patient not taking: Reported on 02/14/2024) 60 mL 0   Dulaglutide (TRULICITY [place])  Trulicity (Patient not taking: Reported on 02/14/2024)     fluticasone  (FLONASE ) 50 MCG/ACT nasal spray Place 2 sprays into both nostrils daily. (Patient not taking: Reported on 02/14/2024) 1 g 0   ipratropium (ATROVENT ) 0.06 % nasal spray Place 2 sprays into both nostrils 4 (four) times daily. (Patient not taking: Reported on 02/14/2024) 15 mL 0   metFORMIN  (GLUCOPHAGE ) 1000 MG tablet TAKE 1 TABLET BY MOUTH 2 TIMES DAILY WITH A MEAL. (Patient not taking: Reported on 02/14/2024) 60 tablet 5   methocarbamol  (ROBAXIN ) 500 MG tablet Take 1 tablet (500 mg total) by mouth 2 (two) times daily. (Patient not taking: Reported on 02/14/2024) 10 tablet 0   ondansetron  (ZOFRAN ) 4 MG tablet Take 1 tablet (4 mg total) by mouth every 8 (eight) hours as needed for nausea or vomiting. (Patient not taking: Reported on 02/14/2024) 20 tablet 0   oxyCODONE  10 MG TABS Take 1 tablet (10 mg total) by mouth every 4 (four) hours as needed for moderate pain. (Patient not taking: Reported on 02/14/2024) 20 tablet 0   No current facility-administered medications on file prior to visit.    LABS/IMAGING: No results found for this or any previous visit (from the past 48 hours). No results found.  LIPID PANEL:    Component Value Date/Time   CHOL 198 07/11/2015 0840   TRIG 95 07/11/2015 0840   HDL 52 07/11/2015 0840   CHOLHDL 3.8 07/11/2015 0840   VLDL 19 07/11/2015 0840   LDLCALC 127 07/11/2015 0840    WEIGHTS: Wt Readings from Last 3 Encounters:  02/14/24 276 lb 12.8 oz (125.6 kg)  11/30/23 268 lb (121.6 kg)  02/18/23 278 lb (126.1 kg)    VITALS: BP 126/74 (BP Location: Left Arm, Patient Position: Sitting)   Pulse 70   Ht 5' 6 (1.676 m)   Wt 276 lb 12.8 oz (125.6 kg)   SpO2 97%   BMI 44.68 kg/m   EXAM: Deferred  EKG: Deferred  ASSESSMENT: Mixed dyslipidemia, goal LDL less than 70 Elevated LP(a) at 80.9 nmol/L-improved to 30.6 nmol/L on Repatha  Type 2 diabetes Morbid obesity Hypertension statin  intolerance Statin intolerance-Myalgias  PLAN: 1.   Ms. Heyde continues to have a reasonably good response to Repatha  although her cholesterols gone up.  I suspect diet and lifestyle are mostly responsible for this.  Fortunately she is been on the Mounjaro and is working to lower her weight and A1c.  I think this will further improve her cholesterol.  Fortunately her LP(a) improved, in fact normalized on the Repatha .  I would recommend continue her current therapy and monitoring her cholesterol in the setting of weight loss.  Follow-up annually with our lipid APP or sooner if necessary.  Vinie KYM Maxcy, MD, [facility], FNLA, FACP  [place]  [facility]  Medical Director of the Advanced Lipid Disorders &  Cardiovascular Risk Reduction Clinic Diplomate  of the [employer] of Clinical Lipidology Attending Cardiologist  Direct Dial: [phone]  Fax: [phone]  Website:  [email or web address] Vinie BROCKS Remi Lopata 02/14/2024, 9:17 AM
